# Patient Record
Sex: Female | Born: 1975 | Race: White | Hispanic: No | Marital: Married | Smoking: Never smoker
Health system: Southern US, Community
[De-identification: ages and names within clinical notes are randomized; demographics above are authoritative.]

---

## 2010-05-01 ENCOUNTER — Ambulatory Visit: Payer: Self-pay | Admitting: Internal Medicine

## 2017-11-07 ENCOUNTER — Inpatient Hospital Stay
Admission: EM | Admit: 2017-11-07 | Discharge: 2017-11-10 | DRG: 864 | Disposition: A | Discharge status: Other Acute Inpatient Hospital | Payer: BLUE CROSS/BLUE SHIELD | Attending: Obstetrics and Gynecology | Admitting: Obstetrics and Gynecology

## 2017-11-07 ENCOUNTER — Encounter: Payer: Self-pay | Admitting: Emergency Medicine

## 2017-11-07 ENCOUNTER — Other Ambulatory Visit: Payer: Self-pay

## 2017-11-07 ENCOUNTER — Other Ambulatory Visit (HOSPITAL_COMMUNITY): Payer: Self-pay | Admitting: Physician Assistant

## 2017-11-07 ENCOUNTER — Emergency Department: Payer: BLUE CROSS/BLUE SHIELD

## 2017-11-07 ENCOUNTER — Other Ambulatory Visit: Payer: Self-pay | Admitting: Physician Assistant

## 2017-11-07 ENCOUNTER — Inpatient Hospital Stay: Payer: BLUE CROSS/BLUE SHIELD

## 2017-11-07 ENCOUNTER — Ambulatory Visit
Admission: RE | Admit: 2017-11-07 | Discharge: 2017-11-07 | Disposition: A | Discharge status: Home / Self Care | Payer: BLUE CROSS/BLUE SHIELD | Source: Ambulatory Visit | Attending: Physician Assistant | Admitting: Physician Assistant

## 2017-11-07 DIAGNOSIS — R740 Nonspecific elevation of levels of transaminase and lactic acid dehydrogenase [LDH]: Secondary | ICD-10-CM | POA: Diagnosis present

## 2017-11-07 DIAGNOSIS — R197 Diarrhea, unspecified: Secondary | ICD-10-CM | POA: Diagnosis not present

## 2017-11-07 DIAGNOSIS — O020 Blighted ovum and nonhydatidiform mole: Secondary | ICD-10-CM

## 2017-11-07 DIAGNOSIS — B349 Viral infection, unspecified: Secondary | ICD-10-CM | POA: Diagnosis present

## 2017-11-07 DIAGNOSIS — Z3201 Encounter for pregnancy test, result positive: Secondary | ICD-10-CM

## 2017-11-07 DIAGNOSIS — D696 Thrombocytopenia, unspecified: Secondary | ICD-10-CM | POA: Diagnosis present

## 2017-11-07 DIAGNOSIS — O019 Hydatidiform mole, unspecified: Secondary | ICD-10-CM

## 2017-11-07 DIAGNOSIS — R509 Fever, unspecified: Secondary | ICD-10-CM | POA: Diagnosis present

## 2017-11-07 DIAGNOSIS — N858 Other specified noninflammatory disorders of uterus: Secondary | ICD-10-CM | POA: Diagnosis present

## 2017-11-07 DIAGNOSIS — N859 Noninflammatory disorder of uterus, unspecified: Secondary | ICD-10-CM | POA: Diagnosis present

## 2017-11-07 DIAGNOSIS — R102 Pelvic and perineal pain: Secondary | ICD-10-CM

## 2017-11-07 DIAGNOSIS — R0902 Hypoxemia: Secondary | ICD-10-CM

## 2017-11-07 DIAGNOSIS — R1084 Generalized abdominal pain: Secondary | ICD-10-CM

## 2017-11-07 DIAGNOSIS — R7401 Elevation of levels of liver transaminase levels: Secondary | ICD-10-CM

## 2017-11-07 DIAGNOSIS — R1011 Right upper quadrant pain: Secondary | ICD-10-CM

## 2017-11-07 LAB — CBC WITH DIFFERENTIAL/PLATELET
Basophils Absolute: 0 10*3/uL (ref 0–0.1)
Basophils Relative: 0 %
EOS ABS: 0 10*3/uL (ref 0–0.7)
Eosinophils Relative: 0 %
HEMATOCRIT: 47 % (ref 35.0–47.0)
HEMOGLOBIN: 16.3 g/dL — AB (ref 12.0–16.0)
LYMPHS ABS: 0.8 10*3/uL — AB (ref 1.0–3.6)
Lymphocytes Relative: 13 %
MCH: 30.7 pg (ref 26.0–34.0)
MCHC: 34.7 g/dL (ref 32.0–36.0)
MCV: 88.3 fL (ref 80.0–100.0)
MONOS PCT: 5 %
Monocytes Absolute: 0.3 10*3/uL (ref 0.2–0.9)
NEUTROS PCT: 82 %
Neutro Abs: 5 10*3/uL (ref 1.4–6.5)
Platelets: 187 10*3/uL (ref 150–440)
RBC: 5.33 MIL/uL — AB (ref 3.80–5.20)
RDW: 14.2 % (ref 11.5–14.5)
WBC: 6.1 10*3/uL (ref 3.6–11.0)

## 2017-11-07 LAB — URINALYSIS, COMPLETE (UACMP) WITH MICROSCOPIC
BILIRUBIN URINE: NEGATIVE
GLUCOSE, UA: NEGATIVE mg/dL
HGB URINE DIPSTICK: NEGATIVE
Ketones, ur: NEGATIVE mg/dL
Leukocytes, UA: NEGATIVE
NITRITE: NEGATIVE
Protein, ur: NEGATIVE mg/dL
Specific Gravity, Urine: >1.046 — ABNORMAL HIGH (ref 1.005–1.030)
pH: 5 (ref 5.0–8.0)

## 2017-11-07 LAB — APTT: APTT: 25 s (ref 24–36)

## 2017-11-07 LAB — COMPREHENSIVE METABOLIC PANEL
ALT: 77 U/L — AB (ref 14–54)
AST: 103 U/L — AB (ref 15–41)
Albumin: 4.5 g/dL (ref 3.5–5.0)
Alkaline Phosphatase: 138 U/L — ABNORMAL HIGH (ref 38–126)
Anion gap: 13 (ref 5–15)
BUN: 10 mg/dL (ref 6–20)
CHLORIDE: 104 mmol/L (ref 101–111)
CO2: 23 mmol/L (ref 22–32)
CREATININE: 0.75 mg/dL (ref 0.44–1.00)
Calcium: 9.4 mg/dL (ref 8.9–10.3)
GFR calc non Af Amer: >60 mL/min (ref >60)
Glucose, Bld: 93 mg/dL (ref 65–99)
POTASSIUM: 3.5 mmol/L (ref 3.5–5.1)
SODIUM: 140 mmol/L (ref 135–145)
Total Bilirubin: 0.8 mg/dL (ref 0.3–1.2)
Total Protein: 8.4 g/dL — ABNORMAL HIGH (ref 6.5–8.1)

## 2017-11-07 LAB — HCG, QUANTITATIVE, PREGNANCY: hCG, Beta Chain, Quant, S: <1 m[IU]/mL (ref <5)

## 2017-11-07 LAB — PREGNANCY, URINE: PREG TEST UR: NEGATIVE

## 2017-11-07 LAB — PROTIME-INR
INR: 0.97
Prothrombin Time: 12.8 seconds (ref 11.4–15.2)

## 2017-11-07 LAB — ABO/RH: ABO/RH(D): B NEG

## 2017-11-07 MED ORDER — IOHEXOL 300 MG/ML  SOLN
100.0000 mL | Freq: Once | INTRAMUSCULAR | Status: AC | PRN
  Administered 2017-11-07: 100 mL via INTRAVENOUS

## 2017-11-07 MED ORDER — ACETAMINOPHEN 325 MG PO TABS
650.0000 mg | ORAL_TABLET | Freq: Four times a day (QID) | ORAL | Status: DC | PRN
  Administered 2017-11-07 – 2017-11-08 (×4): 650 mg via ORAL
  Filled 2017-11-07 (×4): qty 2

## 2017-11-07 MED ORDER — ONDANSETRON HCL 4 MG/2ML IJ SOLN
INTRAMUSCULAR | Status: AC
  Administered 2017-11-07: 4 mg via INTRAVENOUS
  Filled 2017-11-07: qty 2

## 2017-11-07 MED ORDER — ONDANSETRON HCL 4 MG/2ML IJ SOLN
4.0000 mg | Freq: Four times a day (QID) | INTRAMUSCULAR | Status: DC | PRN
  Administered 2017-11-07 – 2017-11-10 (×6): 4 mg via INTRAVENOUS
  Filled 2017-11-07 (×5): qty 2

## 2017-11-07 MED ORDER — ONDANSETRON HCL 4 MG PO TABS
4.0000 mg | ORAL_TABLET | Freq: Four times a day (QID) | ORAL | Status: DC | PRN
  Administered 2017-11-09: 4 mg via ORAL
  Filled 2017-11-07: qty 1

## 2017-11-07 MED ORDER — LACTATED RINGERS IV SOLN
INTRAVENOUS | Status: DC
  Administered 2017-11-08 (×2): via INTRAVENOUS

## 2017-11-07 MED ORDER — DOCUSATE SODIUM 100 MG PO CAPS
100.0000 mg | ORAL_CAPSULE | Freq: Two times a day (BID) | ORAL | Status: DC
  Administered 2017-11-08: 100 mg via ORAL
  Filled 2017-11-07: qty 1

## 2017-11-07 MED ORDER — ACETAMINOPHEN 650 MG RE SUPP: 650.0000 mg | Freq: Four times a day (QID) | RECTAL | Status: DC | PRN

## 2017-11-07 MED ORDER — SODIUM CHLORIDE 0.9 % IV BOLUS
1000.0000 mL | Freq: Once | INTRAVENOUS | Status: AC
  Administered 2017-11-07: 1000 mL via INTRAVENOUS

## 2017-11-07 NOTE — ED Notes (Signed)
Patient transported to X-ray 

## 2017-11-07 NOTE — ED Provider Notes (Signed)
[facility] Emergency Department Provider Note  ____________________________________________  Time seen: Approximately 7:07 PM  I have reviewed the triage vital signs and the nursing notes.   HISTORY  Chief Complaint Pelvic Pain    HPI Meagan HumblesHeather Figueroa is a 42 y.o. female comes to the ED for follow-up of a abnormal outpatient ultrasound. She reports that she's had pelvic pain and cramping and abdominal bloating for the past week associated low-grade fever. He saw her doctor in clinic who obtained a urine pregnancy test which was positive according to the patient. She was sent to the ED for ultrasound.  Denies chills or sweats. Fevers are intermittent. No chest pain or shortness of breath. Discharge. Last menstrual period was 09/02/2017.  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      History reviewed. No pertinent past medical history.   There are no active problems to display for this patient.    History reviewed. No pertinent surgical history.   Prior to Admission medications   Not on File  none   Allergies Prednisone   History reviewed. No pertinent family history.  Social History Social History   Tobacco Use  . Smoking status: Never Smoker  . Smokeless tobacco: Never Used  Substance Use Topics  . Alcohol use: Not Currently  . Drug use: Never    Review of Systems  Constitutional:   positive fever without chills.  ENT:   No sore throat. No rhinorrhea. Cardiovascular:   No chest pain or syncope. Respiratory:   No dyspnea or cough. Gastrointestinal:   positive pelvic pain without vomiting and diarrhea.  Musculoskeletal:   Negative for focal pain or swelling All other systems reviewed and are negative except as documented above in ROS and HPI.  ____________________________________________   PHYSICAL EXAM:  VITAL SIGNS: ED Triage Vitals  Enc Vitals Group     BP 11/07/17 1727 (!) 134/91     Pulse  Rate 11/07/17 1727 98     Resp 11/07/17 1727 18     Temp 11/07/17 1727 (!) 100.5 F (38.1 C)     Temp Source 11/07/17 1727 Oral     SpO2 11/07/17 1727 96 %     Weight 11/07/17 1727 183 lb (83 kg)     Height 11/07/17 1727 5\' 8"  (1.727 m)     Head Circumference --      Peak Flow --      Pain Score 11/07/17 1732 3     Pain Loc --      Pain Edu? --      Excl. in GC? --     Vital signs reviewed, nursing assessments reviewed.   Constitutional:   Alert and oriented. Well appearing and in no distress. Eyes:   Conjunctivae are normal. EOMI. PERRL. ENT      Head:   Normocephalic and atraumatic.      Nose:   No congestion/rhinnorhea.       Mouth/Throat:   MMM, no pharyngeal erythema. No peritonsillar mass.       Neck:   No meningismus. Full ROM. Hematological/Lymphatic/Immunilogical:   No cervical lymphadenopathy. Cardiovascular:   RRR. Symmetric bilateral radial and DP pulses.  No murmurs.  Respiratory:   Normal respiratory effort without tachypnea/retractions. Breath sounds are clear and equal bilaterally. No wheezes/rales/rhonchi. Gastrointestinal:   Soft, suprapubic tenderness. Non distended. There is no CVA tenderness.  No rebound, rigidity, or guarding.  Musculoskeletal:   Normal range of motion in all extremities. No joint effusions.  No lower extremity tenderness.  No edema. Neurologic:   Normal speech and language.  Motor grossly intact. No acute focal neurologic deficits are appreciated.  Skin:    Skin is warm, dry and intact. No rash noted.  No petechiae, purpura, or bullae.  ____________________________________________    LABS (pertinent positives/negatives) (all labs ordered are listed, but only abnormal results are displayed) Labs Reviewed  COMPREHENSIVE METABOLIC PANEL - Abnormal; Notable for the following components:      Result Value   Total Protein 8.4 (*)    AST 103 (*)    ALT 77 (*)    Alkaline Phosphatase 138 (*)    All other components within normal limits   CBC WITH DIFFERENTIAL/PLATELET - Abnormal; Notable for the following components:   RBC 5.33 (*)    Hemoglobin 16.3 (*)    Lymphs Abs 0.8 (*)    All other components within normal limits  HCG, QUANTITATIVE, PREGNANCY  APTT  PROTIME-INR  URINALYSIS, COMPLETE (UACMP) WITH MICROSCOPIC  ABO/RH   ____________________________________________   EKG    ____________________________________________    RADIOLOGY  US Ob Less Than 14 Weeks With Ob Transvaginal  Result Date: 11/07/2017 CLINICAL DATA:  Pelvic cramping and pain with positive pregnancy test. EXAM: OBSTETRIC <14 WK [place] AND TRANSVAGINAL OB US TECHNIQUE: Both transabdominal and transvaginal ultrasound examinations were performed for complete evaluation of the gestation as well as the maternal uterus, adnexal regions, and pelvic cul-de-sac. Transvaginal technique was performed to assess early pregnancy. COMPARISON:  None. FINDINGS: Intrauterine gestational sac: Not visualized. Yolk sac:  Not visualized. Embryo:  Not visualized. Abnormal tissue fills and expands the endometrial canal to 5 cm in diameter. The abnormal tissue is diffusely hyperechoic with innumerable cystic foci contained within. This tissue tracks through the internal cervical os into the cervical canal. Subchorionic hemorrhage:  None visualized. Maternal uterus/adnexae: Maternal ovaries are unremarkable. No evidence for intraperitoneal free fluid. IMPRESSION: Abnormal hyperechoic tissue in the endometrial cavity with innumerable cystic foci. Ultrasound imaging features are consistent with gestational trophoblastic disease which includes etiologies such as hydatidiform mole and choriocarcinoma. These results will be called to the ordering clinician or representative by the Radiologist Assistant, and communication documented in the PACS or zVision Dashboard. Electronically Signed   By: Kennith Center M.D.   On: 11/07/2017 15:54     ____________________________________________   PROCEDURES Procedures  ____________________________________________    CLINICAL IMPRESSION / ASSESSMENT AND PLAN / ED COURSE  Pertinent labs & imaging results that were available during my care of the patient were reviewed by me and considered in my medical decision making (see chart for details).      Clinical Course as of Nov 07 1908  Fri Nov 07, 2017  1807 ultrasound shows molar pregnancy. Case discussed with gynecology Dr. Bascom Levels  who will evaluate. No current evidence of cardiopulmonary complication.    [PS]    Clinical Course User Index [PS] Sharman Cheek, MD     ____________________________________________   FINAL CLINICAL IMPRESSION(S) / ED DIAGNOSES    Final diagnoses:  Gestational trophoblastic disease     ED Discharge Orders    None      Portions of this note were generated with dragon dictation software. Dictation errors may occur despite best attempts at proofreading.    Sharman Cheek, MD 11/07/17 1910

## 2017-11-07 NOTE — H&P (Signed)
History and Physical  Meagan Figueroa is an 42 y.o. female.  HPI: Patient presents today from an urgent care. She was being seen for a fever. She was found to be pregnant and an ultrasound was ordered. The ultrasound showed a molar pregnancy and she was recalled to the ER. She reports that she has been having fevers and chills all week.  She also feels like her glands in her throat were swollen.  She has diffuse body aches. she denies any cough or nasal drainage.  She denies any nausea or vomiting.  She does not know when the last time she had a menstrual period.  She reports that for the last 6 months she has been having several days of small brown spotting generally at the end of the month which she is considered a period.  She has a lifelong history of abnormal menstrual periods.  She says that she generally does not get periods.  The last time she had a normal period was 4 years ago after her wedding.  She has been having regular intercourse and is unsure of when she would have conceived.  She does not use any form of contraception.  She is seen occasional bleeding after intercourse which she thought was normal because of less moisture.  The bleeding is generally small and only when she wipes.   She has no prior pregnancies.  She is not interested in conceiving in the future and is open to the idea of a hysterectomy.  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  She and her husband brought with her a signed and notarized statement of this wish.   History reviewed. No pertinent past medical history.  History reviewed. No pertinent surgical history.  History reviewed. No pertinent family history.  Social History:  reports that she has never smoked. She has never used smokeless tobacco. She reports that she drank alcohol. She reports that she does not use drugs.  Allergies:  Allergies  Allergen Reactions  . Prednisone     Facial numbness     Medications: I have reviewed the patient's current medications.  Results for orders placed or performed during the hospital encounter of 11/07/17 (from the past 48 hour(s))  Comprehensive metabolic panel     Status: Abnormal   Collection Time: 11/07/17  5:47 PM  Result Value Ref Range   Sodium 140 135 - 145 mmol/L   Potassium 3.5 3.5 - 5.1 mmol/L   Chloride 104 101 - 111 mmol/L   CO2 23 22 - 32 mmol/L   Glucose, Bld 93 65 - 99 mg/dL   BUN 10 6 - 20 mg/dL   Creatinine, Ser 0.75 0.44 - 1.00 mg/dL   Calcium 9.4 8.9 - 10.3 mg/dL   Total Protein 8.4 (H) 6.5 - 8.1 g/dL   Albumin 4.5 3.5 - 5.0 g/dL   AST 103 (H) 15 - 41 U/L   ALT 77 (H) 14 - 54 U/L   Alkaline Phosphatase 138 (H) 38 - 126 U/L   Total Bilirubin 0.8 0.3 - 1.2 mg/dL   GFR calc non Af Amer >60 >60 mL/min   GFR calc Af Amer >60 >60 mL/min    Comment: (NOTE) The eGFR has been calculated using the CKD EPI equation. This calculation has not been validated in all clinical situations. eGFR's persistently <60 mL/min signify possible Chronic Kidney Disease.    Anion gap 13 5 - 15    Comment: Performed  at [facility], [address] [place], [place] [address]  CBC with Differential     Status: Abnormal   Collection Time: 11/07/17  5:47 PM  Result Value Ref Range   WBC 6.1 3.6 - 11.0 K/uL   RBC 5.33 (H) 3.80 - 5.20 MIL/uL   Hemoglobin 16.3 (H) 12.0 - 16.0 g/dL   HCT 47.0 35.0 - 47.0 %   MCV 88.3 80.0 - 100.0 fL   MCH 30.7 26.0 - 34.0 pg   MCHC 34.7 32.0 - 36.0 g/dL   RDW 14.2 11.5 - 14.5 %   Platelets 187 150 - 440 K/uL   Neutrophils Relative % 82 %   Neutro Abs 5.0 1.4 - 6.5 K/uL   Lymphocytes Relative 13 %   Lymphs Abs 0.8 (L) 1.0 - 3.6 K/uL   Monocytes Relative 5 %   Monocytes Absolute 0.3 0.2 - 0.9 K/uL   Eosinophils Relative 0 %   Eosinophils Absolute 0.0 0 - 0.7 K/uL   Basophils Relative 0 %   Basophils Absolute 0.0 0 - 0.1 K/uL    Comment: Performed at [facility], [address] [place], [place] [address]  ABO/Rh     Status: None   Collection Time: 11/07/17  5:47 PM  Result Value Ref Range   ABO/RH(D)      B NEG Performed at [facility], [address] [place], [place] [address]   hCG, quantitative, pregnancy     Status: None   Collection Time: 11/07/17  5:47 PM  Result Value Ref Range   hCG, Beta Chain, Quant, S <1 <5 mIU/mL    Comment:          GEST. AGE      CONC.  (mIU/mL)   <=1 WEEK        5 - 50     2 WEEKS       50 - 500     3 WEEKS       100 - 10,000     4 WEEKS     1,000 - 30,000     5 WEEKS     3,500 - 115,000   6-8 WEEKS     12,000 - 270,000    12 WEEKS     15,000 - 220,000        FEMALE AND NON-PREGNANT FEMALE:     LESS THAN 5 mIU/mL Performed at [facility], [address] [place], [place] [address]     Dg Chest 2 View  Addendum Date: 11/07/2017   ADDENDUM REPORT: 11/07/2017 20:03 ADDENDUM: Note that under the clinical data, it should read molar pregnancy instead of more pregnancy. Electronically Signed   By: Marin Olp M.D.   On: 11/07/2017 20:03   Result Date: 11/07/2017 CLINICAL DATA:  More pregnancy. Pelvic pain. Fever and weakness 4 days. EXAM: CHEST - 2 VIEW COMPARISON:  None. FINDINGS: Lungs are adequately inflated and otherwise clear. Cardiomediastinal silhouette is within normal. Bones and soft tissues are normal. IMPRESSION: No active cardiopulmonary disease. Electronically Signed: By: Marin Olp M.D. On: 11/07/2017 19:41   US Ob Less Than 14 Weeks With Ob Transvaginal  Result Date: 11/07/2017 CLINICAL DATA:  Pelvic cramping and pain with positive pregnancy test. EXAM: OBSTETRIC <14 WK [place] AND TRANSVAGINAL OB US TECHNIQUE: Both transabdominal and transvaginal ultrasound examinations were performed for complete evaluation of the gestation as well as the maternal uterus, adnexal regions, and pelvic cul-de-sac. Transvaginal technique was performed to assess  early pregnancy. COMPARISON:  None. FINDINGS:  Intrauterine gestational sac: Not visualized. Yolk sac:  Not visualized. Embryo:  Not visualized. Abnormal tissue fills and expands the endometrial canal to 5 cm in diameter. The abnormal tissue is diffusely hyperechoic with innumerable cystic foci contained within. This tissue tracks through the internal cervical os into the cervical canal. Subchorionic hemorrhage:  None visualized. Maternal uterus/adnexae: Maternal ovaries are unremarkable. No evidence for intraperitoneal free fluid. IMPRESSION: Abnormal hyperechoic tissue in the endometrial cavity with innumerable cystic foci. Ultrasound imaging features are consistent with gestational trophoblastic disease which includes etiologies such as hydatidiform mole and choriocarcinoma. These results will be called to the ordering clinician or representative by the Radiologist Assistant, and communication documented in the PACS or zVision Dashboard. Electronically Signed   By: Misty Stanley M.D.   On: 11/07/2017 15:54    Review of Systems  Constitutional: Negative for chills, fever, malaise/fatigue and weight loss.  HENT: Negative for congestion, hearing loss and sinus pain.   Eyes: Negative for blurred vision and double vision.  Respiratory: Negative for cough, sputum production, shortness of breath and wheezing.   Cardiovascular: Negative for chest pain, palpitations, orthopnea and leg swelling.  Gastrointestinal: Negative for abdominal pain, constipation, diarrhea, nausea and vomiting.  Genitourinary: Negative for dysuria, flank pain, frequency, hematuria and urgency.  Musculoskeletal: Negative for back pain, falls and joint pain.  Skin: Negative for itching and rash.  Neurological: Negative for dizziness and headaches.  Psychiatric/Behavioral: Negative for depression, substance abuse and suicidal ideas. The patient is not nervous/anxious.    Blood pressure (!) 134/91, pulse 98, temperature (!) 100.5 F (38.1 C), temperature source Oral, resp. rate  18, height _0  (1.727 m), weight 183 lb (83 kg), last menstrual period 09/02/2017, SpO2 96 %. Physical Exam  Nursing note and vitals reviewed. Constitutional: She is oriented to person, place, and time. She appears well-developed and well-nourished.  HENT:  Head: Normocephalic and atraumatic.    Neck: Neck supple. No thyromegaly present.  Cardiovascular: Normal rate and regular rhythm.  Respiratory: Effort normal and breath sounds normal. No respiratory distress. She has no wheezes.  GI: Soft. Bowel sounds are normal. She exhibits no distension and no mass. There is no tenderness. There is no rebound and no guarding.  Genitourinary: Vagina normal and uterus normal. There is no rash, tenderness, lesion or injury on the right labia. There is no rash, tenderness, lesion or injury on the left labia. Cervix exhibits no motion tenderness, no discharge and no friability. No erythema or bleeding in the vagina. No vaginal discharge found.  Genitourinary Comments: Normal cervix, no bleeding, no abnormal discharge. No molar pregnancy tissue seen coming from the cervical os. Enlarged 15cm uterus. Not fixed, mobile, normal adnexa. Right sided adnexal tenderness.   Musculoskeletal: Normal range of motion.  Neurological: She is alert and oriented to person, place, and time.  Skin: Skin is warm and dry.  Psychiatric: She has a normal mood and affect. Her behavior is normal. Judgment and thought content normal.    Assessment/Plan: 42 yo with a suspected molar pregnancy.  1. CBC, CMP, APTT, PT, INR, type and screen, chest x-ray and beta hcg.   2. Discussed plan of care with surgical management. Patient is open to the idea of a hysterectomy since she is over 22 and does not wish to have any future pregnancies. Will plan for when she is not ill. Beta hcg was less than 1. Will repeat level, this may have been a lab error.  3. Acute viral illness, supportive measures at this time with IV fluids and tylenol for  fever.  4. Elevated alk phos and tranaminases, will obtain abdominal CT to evaluated liver for metastatic lesions.  5. Elevated hemoglobin, will obtain UA to evaluate for dehydration and peripheral smear.     Christanna R Schuman 11/07/2017, 8:36 PM

## 2017-11-07 NOTE — ED Triage Notes (Signed)
Pt presents to ED via POV with c/o pelvic pain and cramping. Pt states was told to check in to the ER due to a growth on her ovaries. Pt had US done here earlier today and had a positive pregnancy test, and fever and body aches. Pt denies bleeding at this time.

## 2017-11-08 ENCOUNTER — Encounter
Admission: EM | Disposition: A | Discharge status: Other Acute Inpatient Hospital | Payer: Self-pay | Source: Home / Self Care | Attending: Obstetrics and Gynecology

## 2017-11-08 DIAGNOSIS — D696 Thrombocytopenia, unspecified: Secondary | ICD-10-CM

## 2017-11-08 DIAGNOSIS — N859 Noninflammatory disorder of uterus, unspecified: Secondary | ICD-10-CM

## 2017-11-08 DIAGNOSIS — R509 Fever, unspecified: Secondary | ICD-10-CM

## 2017-11-08 LAB — LIPASE, BLOOD: Lipase: 20 U/L (ref 11–51)

## 2017-11-08 LAB — CBC
HCT: 40.8 % (ref 35.0–47.0)
HEMOGLOBIN: 14 g/dL (ref 12.0–16.0)
MCH: 30.2 pg (ref 26.0–34.0)
MCHC: 34.3 g/dL (ref 32.0–36.0)
MCV: 88 fL (ref 80.0–100.0)
Platelets: 158 10*3/uL (ref 150–440)
RBC: 4.64 MIL/uL (ref 3.80–5.20)
RDW: 13.8 % (ref 11.5–14.5)
WBC: 5.3 10*3/uL (ref 3.6–11.0)

## 2017-11-08 LAB — COMPREHENSIVE METABOLIC PANEL
ALBUMIN: 3.5 g/dL (ref 3.5–5.0)
ALK PHOS: 112 U/L (ref 38–126)
ALT: 66 U/L — ABNORMAL HIGH (ref 14–54)
AST: 83 U/L — AB (ref 15–41)
Anion gap: 8 (ref 5–15)
BILIRUBIN TOTAL: 0.8 mg/dL (ref 0.3–1.2)
BUN: 9 mg/dL (ref 6–20)
CALCIUM: 8.4 mg/dL — AB (ref 8.9–10.3)
CO2: 22 mmol/L (ref 22–32)
Chloride: 110 mmol/L (ref 101–111)
Creatinine, Ser: 0.72 mg/dL (ref 0.44–1.00)
GFR calc Af Amer: >60 mL/min (ref >60)
GFR calc non Af Amer: >60 mL/min (ref >60)
GLUCOSE: 110 mg/dL — AB (ref 65–99)
POTASSIUM: 3.5 mmol/L (ref 3.5–5.1)
Sodium: 140 mmol/L (ref 135–145)
Total Protein: 6.6 g/dL (ref 6.5–8.1)

## 2017-11-08 LAB — LACTIC ACID, PLASMA: Lactic Acid, Venous: 1.1 mmol/L (ref 0.5–1.9)

## 2017-11-08 SURGERY — DILATION AND CURETTAGE
Anesthesia: Choice

## 2017-11-08 MED ORDER — PIPERACILLIN-TAZOBACTAM 3.375 G IVPB
3.3750 g | Freq: Three times a day (TID) | INTRAVENOUS | Status: DC
  Administered 2017-11-08 – 2017-11-10 (×6): 3.375 g via INTRAVENOUS
  Filled 2017-11-08 (×7): qty 50

## 2017-11-08 MED ORDER — SODIUM CHLORIDE 0.9 % IV BOLUS
1000.0000 mL | Freq: Once | INTRAVENOUS | Status: AC
  Administered 2017-11-08: 1000 mL via INTRAVENOUS

## 2017-11-08 MED ORDER — METRONIDAZOLE 500 MG PO TABS
500.0000 mg | ORAL_TABLET | Freq: Three times a day (TID) | ORAL | Status: DC
  Administered 2017-11-08 – 2017-11-09 (×5): 500 mg via ORAL
  Filled 2017-11-08 (×8): qty 1

## 2017-11-08 MED ORDER — POTASSIUM CHLORIDE IN NACL 20-0.45 MEQ/L-% IV SOLN
INTRAVENOUS | Status: DC
  Administered 2017-11-08: 15:00:00 via INTRAVENOUS
  Filled 2017-11-08 (×2): qty 1000

## 2017-11-08 MED ORDER — LACTATED RINGERS IV BOLUS
1000.0000 mL | Freq: Once | INTRAVENOUS | Status: AC
  Administered 2017-11-08: 1000 mL via INTRAVENOUS

## 2017-11-08 MED ORDER — IBUPROFEN 600 MG PO TABS
600.0000 mg | ORAL_TABLET | Freq: Four times a day (QID) | ORAL | Status: DC | PRN
  Administered 2017-11-08: 600 mg via ORAL
  Filled 2017-11-08: qty 1

## 2017-11-08 SURGICAL SUPPLY — 55 items
BAG COUNTER SPONGE EZ (MISCELLANEOUS) ×2 IMPLANT
BAG URINE DRAINAGE (UROLOGICAL SUPPLIES) ×3 IMPLANT
BLADE SURG SZ11 CARB STEEL (BLADE) ×3 IMPLANT
CANISTER SUCT 1200ML W/VALVE (MISCELLANEOUS) ×3 IMPLANT
CATH FOLEY 2WAY  5CC 16FR (CATHETERS) ×2
CATH ROBINSON RED A/P 16FR (CATHETERS) ×3 IMPLANT
CATH URTH 16FR FL 2W BLN LF (CATHETERS) ×1 IMPLANT
CHLORAPREP W/TINT 26ML (MISCELLANEOUS) ×3 IMPLANT
COUNTER SPONGE BAG EZ (MISCELLANEOUS) ×1
DEFOGGER SCOPE WARMER CLEARIFY (MISCELLANEOUS) ×3 IMPLANT
DERMABOND ADVANCED (GAUZE/BANDAGES/DRESSINGS) ×2
DERMABOND ADVANCED .7 DNX12 (GAUZE/BANDAGES/DRESSINGS) ×1 IMPLANT
DEVICE SUTURE ENDOST 10MM (ENDOMECHANICALS) IMPLANT
DRAPE CAMERA CLOSED 9X96 (DRAPES) ×3 IMPLANT
GLOVE BIO SURGEON STRL SZ8 (GLOVE) ×3 IMPLANT
GLOVE SKINSENSE NS SZ6.5 (GLOVE) ×10
GLOVE SKINSENSE STRL SZ6.5 (GLOVE) ×5 IMPLANT
GLOVE SURG SYN 6.5 ES PF (GLOVE) ×3 IMPLANT
GOWN STRL REUS W/ TWL LRG LVL3 (GOWN DISPOSABLE) ×3 IMPLANT
GOWN STRL REUS W/ TWL XL LVL3 (GOWN DISPOSABLE) ×1 IMPLANT
GOWN STRL REUS W/TWL LRG LVL3 (GOWN DISPOSABLE) ×6
GOWN STRL REUS W/TWL XL LVL3 (GOWN DISPOSABLE) ×2
GRASPER SUT TROCAR 14GX15 (MISCELLANEOUS) IMPLANT
IRRIGATION STRYKERFLOW (MISCELLANEOUS) IMPLANT
IRRIGATOR STRYKERFLOW (MISCELLANEOUS)
IV LACTATED RINGERS 1000ML (IV SOLUTION) ×6 IMPLANT
KIT PINK PAD W/HEAD ARE REST (MISCELLANEOUS) ×3
KIT PINK PAD W/HEAD ARM REST (MISCELLANEOUS) ×1 IMPLANT
KIT TURNOVER CYSTO (KITS) ×6 IMPLANT
LIGASURE LAP MARYLAND 5MM 37CM (ELECTROSURGICAL) IMPLANT
MANIPULATOR VCARE LG CRV RETR (MISCELLANEOUS) IMPLANT
MANIPULATOR VCARE SML CRV RETR (MISCELLANEOUS) IMPLANT
MANIPULATOR VCARE STD CRV RETR (MISCELLANEOUS) IMPLANT
NEEDLE VERESS 14GA 120MM (NEEDLE) IMPLANT
NS IRRIG 500ML POUR BTL (IV SOLUTION) ×3 IMPLANT
OCCLUDER COLPOPNEUMO (BALLOONS) ×3 IMPLANT
PACK DNC HYST (MISCELLANEOUS) ×3 IMPLANT
PACK GYN LAPAROSCOPIC (MISCELLANEOUS) ×3 IMPLANT
PAD OB MATERNITY 4.3X12.25 (PERSONAL CARE ITEMS) ×3 IMPLANT
PAD PREP 24X41 OB/GYN DISP (PERSONAL CARE ITEMS) ×3 IMPLANT
SCISSORS METZENBAUM CVD 33 (INSTRUMENTS) IMPLANT
SET CYSTO W/LG BORE CLAMP LF (SET/KITS/TRAYS/PACK) ×3 IMPLANT
SHEARS HARMONIC ACE PLUS 36CM (ENDOMECHANICALS) IMPLANT
SLEEVE ENDOPATH XCEL 5M (ENDOMECHANICALS) ×3 IMPLANT
SUT ENDO VLOC 180-0-8IN (SUTURE) IMPLANT
SUT MNCRL 4-0 (SUTURE) ×2
SUT MNCRL 4-0 27XMFL (SUTURE) ×1
SUT VIC AB 0 CT1 36 (SUTURE) ×3 IMPLANT
SUTURE MNCRL 4-0 27XMF (SUTURE) ×1 IMPLANT
SYR 10ML LL (SYRINGE) ×3 IMPLANT
SYR 50ML LL SCALE MARK (SYRINGE) ×3 IMPLANT
TOWEL OR 17X26 4PK STRL BLUE (TOWEL DISPOSABLE) ×3 IMPLANT
TROCAR ENDO BLADELESS 11MM (ENDOMECHANICALS) ×3 IMPLANT
TROCAR XCEL NON-BLD 5MMX100MML (ENDOMECHANICALS) ×3 IMPLANT
TUBING INSUF HEATED (TUBING) ×3 IMPLANT

## 2017-11-08 NOTE — Progress Notes (Signed)
Patient ID: Meagan Figueroa, female   DOB: [birth year], 42 y.o.   MRN: [id number]  Daily Benign Gynecology Progress Note Meagan Figueroa  [id number]  HD#2 admitted with fever of unknown origin and uterine mass  Subjective:  Overnight Events: patient became disoriented and required some O2 for saturation support Complaints: weakness, mild nausea, fevers, sometimes feels cold, denies chills, occasional heart palpitations. She denies: chest pain, trouble breathing, vomiting, severe abdominal pain.  She has tolerated: minimal regular diet (not hungry or thirsty) She reports her pain is well controlled without requiring pain medication She is ambulating though she has become disoriented a little dizzy and is voiding.  Objective:  Most recent vitals Temp: (!) 101.7 F (38.7 C)  BP: 106/73  Pulse Rate: 90  Resp: 18  SpO2: 96 %   Vitals Range over 24 hours Temp  Avg: 101 F (38.3 C)  Min: 99.1 F (37.3 C)  Max: 102.7 F (39.3 C) BP  Min: 93/61  Max: 134/91 Pulse  Avg: 84.2  Min: 71  Max: 98 SpO2  Avg: 95.3 %  Min: 92 %  Max: 100 %   Physical Exam Physical Exam  Constitutional: She is oriented to person, place, and time. She appears well-developed and well-nourished. No distress.  Pale, appears lethargic  HENT:  Head: Normocephalic and atraumatic.  Eyes: Conjunctivae are normal. No scleral icterus.  Neck: No thyromegaly present.  Cardiovascular: Normal rate and regular rhythm.  Pulmonary/Chest: Breath sounds normal. No stridor. No respiratory distress. She has no wheezes. She exhibits no tenderness.  Abdominal: Soft. Bowel sounds are normal. She exhibits no distension and no mass. There is no hepatosplenomegaly. There is no tenderness. There is no rebound, no guarding, no CVA tenderness and negative Murphy's sign. No hernia.  Musculoskeletal: Normal range of motion. She exhibits no edema.  Lymphadenopathy:    She has cervical adenopathy.  Neurological: She is alert and oriented to person,  place, and time. No cranial nerve deficit.  Skin: Skin is warm and dry. There is pallor.  Psychiatric: She has a normal mood and affect. Her behavior is normal. Judgment normal.   AM Labs Lab Results  Component Value Date   WBC 5.3 11/08/2017   HGB 14.0 11/08/2017   HCT 40.8 11/08/2017   PLT 158 11/08/2017   NA 140 11/08/2017   K 3.5 11/08/2017   CREATININE 0.72 11/08/2017   BUN 9 11/08/2017   INR 0.97 11/07/2017   Recent Labs    11/07/17 1747 11/08/17 0514  AST 103* 83*  ALT 77* 66*    Recent Labs    11/08/17 0514  LIPASE 20    Recent Labs    11/07/17 1747 11/07/17 2023  HCGBETAQNT <1 <1    Recent Labs    11/07/17 2252  PREGTESTUR NEGATIVE    Recent Labs    11/08/17 0514  LATICACIDVEN 1.1     Assessment:  Meagan Figueroa is a 42 y.o. female HD#2 admitted with fever of unknown origin and uterine mass.    Plan:  Fever of unknown origin: * UA not suggestive of UTI * CXR normal * blood cultures x 2 pending * abd/pelvic CT negative * hospitalist consult to assist.   * suspect gastroenteritis, but with temp to 102F+, must consider other sources * IVF bolus for dehyration  Transaminitis:  * LFTs trending down * hospitalist consult  O2 requirement:  * doing well on room air currently * hospitalist consult  Uterine mass:  * spoke with [facility].  The fellow I spoke with [facility] as well as a [facility]. They do not believe this to be GTN based on the two negative beta hCG values and negative urine pregnancy test.  They are concerned about the mass in her uterus. However, they (nor I) believe it is the cause of her current symptoms. They state they will follow her up in clinic on Tuesday with their own lab draw prior to the clinic appointment. They will arrange this and call the patient on Monday pending [facility] by that time.  They do not think at this time that a transfer is  appropriate or necessary given the available information. Though close and quick follow up is warranted.   * Another beta hCG is pending (this was at request of the lab): no result yet. If positive , will contact [facility] again to see if this changes their recommendations. * CT and CXR not suggestive of metastatic disease at this point.  The imaging is, however, quite concerning. We did discuss the imaging and my concerns for disease that needs immediate attention.   * for now will give her IV fluid bolus. Will give her medication for nausea and support her fever episodes.  She may be a candidate for discharge either later today or tomorrow, pending hospitalist consult. She does not appear septic and is not meeting SIRS criteria.  Disposition is pending clinical course.   I spent > 60 minutes in reviewing this case, discussing the case with the off-going attending, discussing the case with doctors from [facility].  I spent > 30 minutes discussing all of the above with the patient and her family. She has been having fevers off and on since Tuesday. She had diarrhea on Wednesday and drank an herbal tea that caused her diarrhea to stop. She reports anorexia since that time and started having nausea yesterday with a couple of episodes of emesis.  Fevers continue. She has had no sick contacts of whom she aware.    Thomasene MohairStephen Linkoln Alkire, MD  11/08/2017 12:26 PM

## 2017-11-08 NOTE — Progress Notes (Signed)
Spoke with Dr. Jean RosenthalJackson in regards to pt temp of 103 after tylenol. Going to obtain another set of VS and MD to put in order for Motrin. Will cont to monitor

## 2017-11-08 NOTE — Progress Notes (Signed)
Dr. Jean RosenthalJackson updated of pt temp 98.5. MD acknowledged

## 2017-11-08 NOTE — Progress Notes (Signed)
Patient temperature elevated to 102.6 orally. Dr. Jerene PitchSchuman notified of temp and to decide treatment due to NPO status. MD order to give PO tylenol for fever. PO tylenol given, along with IV zofran for nausea. Patient then stated that she needed to go to bathroom to void. Patient sat up on side of bed and stated that she was very dizzy. RN called NT to come assist with ambulation to bathroom. Upon standing up at bedside, the patient became pale, diaphoretic, non-verbal, and unsteady. Patient sat back down onto bed. RN asking patient questions with little to no response. Patient then became more aware of surroundings and stated she wished to go to bathroom right now. Patient ambulated into bathroom with RN and NT assistance. Patient voided 400 mL of clear, yellow urine. Patient assisted back to bed. Lab arrived to draw morning labs and blood cultures. Patient vials obtained- BP 93/61, Respirations 22, Temp 100.6, Pulse 82. Dr. Jerene PitchSchuman paged at this time and rapid response called. Shift coordinator called, also. Dr. Jerene PitchSchuman responded and came to room to assess patient. Rapid response team arrived. 1 liter bolus of normal saline hung  and 2 L O2 given via nasal cannula per MD order. Vitals- BP 109/74, pulse 81, O2 95, respirations 26. Patient condition/appearance improved. Patient was more alert, conscious, and responsive at this time. Rapid response team dismissed. Dr. Jerene PitchSchuman spoke with family about condition and answered many questions. Awaiting lab results. Patient advanced to regular (gulten free) diet. MD to come reevaluate patient later. RN will continue to monitor.

## 2017-11-08 NOTE — Consult Note (Signed)
History and Physical    Meagan HumblesHeather Mclin ZOX:[id number] DOB: 24-Sep-1975 DOA: 11/07/2017  Referring physician: Dr. Jean RosenthalJackson PCP: Patient, No Pcp Per  Specialists: none  Chief Complaint: fever  HPI: Meagan Figueroa is a 42 y.o. female with no significant past medical history who was admitted yesterday with abdominal pain and fever by GYN. Pregnancy has been ruled out. Has had intermittent fevers up to 102 degress for over a week now. Infection w/u negative except for some undefined intrauterine abnormality seen on CT. We are now consulted due to persistent fevers. Pt has had slight abdominal pain and nausea. No significant vomiting or diarrhea. CXR and UA negative. WBC normal. Blood cultures NTD  Review of Systems: The patient denies anorexia, weight loss,, vision loss, decreased hearing, hoarseness, chest pain, syncope, dyspnea on exertion, peripheral edema, balance deficits, hemoptysis,  melena, hematochezia, severe indigestion/heartburn, hematuria, incontinence, genital sores, muscle weakness, suspicious skin lesions, transient blindness, difficulty walking, depression, unusual weight change, abnormal bleeding, enlarged lymph nodes, angioedema, and breast masses.   History reviewed. No pertinent past medical history. History reviewed. No pertinent surgical history. Social History:  reports that she has never smoked. She has never used smokeless tobacco. She reports that she drank alcohol. She reports that she does not use drugs.  Allergies  Allergen Reactions  . Prednisone     Facial numbness    History reviewed. No pertinent family history.  Prior to Admission medications   Not on File   Physical Exam: Vitals:   11/08/17 0737 11/08/17 1036 11/08/17 1040 11/08/17 1042  BP: 104/65 106/65 103/68 106/73  Pulse: 71 77 81 90  Resp: 18     Temp: 99.3 F (37.4 C) (!) 101.7 F (38.7 C)    TempSrc: Oral Oral    SpO2: 96% 95% 100% 96%  Weight:      Height:         General:  No  apparent distress, WDWN, [place]/AT  Eyes: PERRL, EOMI, no scleral icterus, conjucntiva clear  ENT: moist oropharynx without exudate, TM's benign, dentition fair  Neck: supple, slight lymphadenopathy noted. No bruits or thyromegaly  Cardiovascular: regular rate without MRG; 2+ peripheral pulses, no JVD, no peripheral edema  Respiratory: CTA biL, good air movement without wheezing, rhonchi or crackled. Respiratory effort normal  Abdomen: soft, mildly tender to palpation, positive bowel sounds, no guarding, no rebound  Skin: no rashes or lesions  Musculoskeletal: normal bulk and tone, no joint swelling  Psychiatric: normal mood and affect, A&OX3  Neurologic: CN 2-12 grossly intact, Motor strength 5/5 in all 4 groups with symmetric DTR's and non-focal sensory exam  Labs on Admission:  Basic Metabolic Panel: Recent Labs  Lab 11/07/17 1747 11/08/17 0514  NA 140 140  K 3.5 3.5  CL 104 110  CO2 23 22  GLUCOSE 93 110*  BUN 10 9  CREATININE 0.75 0.72  CALCIUM 9.4 8.4*   Liver Function Tests: Recent Labs  Lab 11/07/17 1747 11/08/17 0514  AST 103* 83*  ALT 77* 66*  ALKPHOS 138* 112  BILITOT 0.8 0.8  PROT 8.4* 6.6  ALBUMIN 4.5 3.5   Recent Labs  Lab 11/08/17 0514  LIPASE 20   No results for input(s): AMMONIA in the last 168 hours. CBC: Recent Labs  Lab 11/07/17 1747 11/08/17 0514  WBC 6.1 5.3  NEUTROABS 5.0  --   HGB 16.3* 14.0  HCT 47.0 40.8  MCV 88.3 88.0  PLT 187 158   Cardiac Enzymes: No results for input(s): CKTOTAL, CKMB,  CKMBINDEX, TROPONINI in the last 168 hours.  BNP (last 3 results) No results for input(s): BNP in the last 8760 hours.  ProBNP (last 3 results) No results for input(s): PROBNP in the last 8760 hours.  CBG: No results for input(s): GLUCAP in the last 168 hours.  Radiological Exams on Admission: Dg Chest 2 View  Addendum Date: 11/07/2017   ADDENDUM REPORT: 11/07/2017 20:03 ADDENDUM: Note that under the clinical data, it should read  molar pregnancy instead of more pregnancy. Electronically Signed   By: Elberta Fortis M.D.   On: 11/07/2017 20:03   Result Date: 11/07/2017 CLINICAL DATA:  More pregnancy. Pelvic pain. Fever and weakness 4 days. EXAM: CHEST - 2 VIEW COMPARISON:  None. FINDINGS: Lungs are adequately inflated and otherwise clear. Cardiomediastinal silhouette is within normal. Bones and soft tissues are normal. IMPRESSION: No active cardiopulmonary disease. Electronically Signed: By: Elberta Fortis M.D. On: 11/07/2017 19:41   Ct Abdomen Pelvis W Contrast  Result Date: 11/07/2017 CLINICAL DATA:  Fevers and chills all week. Diagnosed with a molar pregnancy earlier today. EXAM: CT ABDOMEN AND PELVIS WITH CONTRAST TECHNIQUE: Multidetector CT imaging of the abdomen and pelvis was performed using the standard protocol following bolus administration of intravenous contrast. CONTRAST:  OMNIPAQUE IOHEXOL 300 MG/ML  SOLN COMPARISON:  Pelvic ultrasound from same day. FINDINGS: Lower chest: No acute abnormality.  Bibasilar atelectasis. Hepatobiliary: No focal liver abnormality is seen. Possible tiny gallstones near the gallbladder neck. Gallbladder fundal adenomyomatosis. No gallbladder wall thickening or biliary dilatation. Pancreas: Unremarkable. No pancreatic ductal dilatation or surrounding inflammatory changes. Spleen: Normal in size without focal abnormality. Adrenals/Urinary Tract: Adrenal glands are unremarkable. Kidneys are normal, without renal calculi, focal lesion, or hydronephrosis. Bladder is unremarkable. Stomach/Bowel: Stomach is within normal limits. Appendix appears normal. No evidence of bowel wall thickening, distention, or inflammatory changes. Vascular/Lymphatic: No significant vascular findings are present. No enlarged abdominal or pelvic lymph nodes. Reproductive: Heterogeneous low-density tissue with areas of internal septation expanding the endometrial canal. The bilateral adnexa are unremarkable. Other: Small fat  containing left inguinal hernia. No free fluid or pneumoperitoneum. Musculoskeletal: No acute or significant osseous findings. IMPRESSION: 1. Heterogeneous low-density tissue expanding the endometrial canal, consistent with gestational trophoblastic disease. 2. No other acute abnormality within the abdomen or pelvis. Electronically Signed   By: Obie Dredge M.D.   On: 11/07/2017 23:03   US Ob Less Than 14 Weeks With Ob Transvaginal  Result Date: 11/07/2017 CLINICAL DATA:  Pelvic cramping and pain with positive pregnancy test. EXAM: OBSTETRIC <14 WK [place] AND TRANSVAGINAL OB US TECHNIQUE: Both transabdominal and transvaginal ultrasound examinations were performed for complete evaluation of the gestation as well as the maternal uterus, adnexal regions, and pelvic cul-de-sac. Transvaginal technique was performed to assess early pregnancy. COMPARISON:  None. FINDINGS: Intrauterine gestational sac: Not visualized. Yolk sac:  Not visualized. Embryo:  Not visualized. Abnormal tissue fills and expands the endometrial canal to 5 cm in diameter. The abnormal tissue is diffusely hyperechoic with innumerable cystic foci contained within. This tissue tracks through the internal cervical os into the cervical canal. Subchorionic hemorrhage:  None visualized. Maternal uterus/adnexae: Maternal ovaries are unremarkable. No evidence for intraperitoneal free fluid. IMPRESSION: Abnormal hyperechoic tissue in the endometrial cavity with innumerable cystic foci. Ultrasound imaging features are consistent with gestational trophoblastic disease which includes etiologies such as hydatidiform mole and choriocarcinoma. These results will be called to the ordering clinician or representative by the Radiologist Assistant, and communication documented in the PACS or zVision  Dashboard. Electronically Signed   By: Kennith Center M.D.   On: 11/07/2017 15:54    EKG: Independently reviewed.  Assessment/Plan Active Problems:   Molar  pregnancy   Cultures sent. Begin empiric IV ABX. Source of fever appears to be intrauterine. Repeat labs in AM. Consider uteroscopy. Will follow  Diet: per GYN Fluids: 1/2 ND @50  DVT Prophylaxis: per GYN  Code Status: FULL  Family Communication: yes  Disposition Plan: TBD  Time spent: 50 min

## 2017-11-09 ENCOUNTER — Inpatient Hospital Stay: Payer: BLUE CROSS/BLUE SHIELD

## 2017-11-09 DIAGNOSIS — R509 Fever, unspecified: Secondary | ICD-10-CM | POA: Diagnosis present

## 2017-11-09 DIAGNOSIS — R7401 Elevation of levels of liver transaminase levels: Secondary | ICD-10-CM | POA: Diagnosis present

## 2017-11-09 DIAGNOSIS — R197 Diarrhea, unspecified: Secondary | ICD-10-CM

## 2017-11-09 DIAGNOSIS — D696 Thrombocytopenia, unspecified: Secondary | ICD-10-CM

## 2017-11-09 DIAGNOSIS — R740 Nonspecific elevation of levels of transaminase and lactic acid dehydrogenase [LDH]: Secondary | ICD-10-CM

## 2017-11-09 DIAGNOSIS — N859 Noninflammatory disorder of uterus, unspecified: Secondary | ICD-10-CM

## 2017-11-09 LAB — CBC WITH DIFFERENTIAL/PLATELET
BASOS ABS: 0 10*3/uL (ref 0–0.1)
BASOS ABS: 0 10*3/uL (ref 0–0.1)
BASOS PCT: 0 %
Basophils Relative: 0 %
Eosinophils Absolute: 0 10*3/uL (ref 0–0.7)
Eosinophils Absolute: 0 10*3/uL (ref 0–0.7)
Eosinophils Relative: 0 %
Eosinophils Relative: 0 %
HCT: 36 % (ref 35.0–47.0)
HEMATOCRIT: 34.8 % — AB (ref 35.0–47.0)
HEMOGLOBIN: 12.3 g/dL (ref 12.0–16.0)
HEMOGLOBIN: 12.7 g/dL (ref 12.0–16.0)
LYMPHS PCT: 18 %
Lymphocytes Relative: 15 %
Lymphs Abs: 0.6 10*3/uL — ABNORMAL LOW (ref 1.0–3.6)
Lymphs Abs: 0.9 10*3/uL — ABNORMAL LOW (ref 1.0–3.6)
MCH: 30.9 pg (ref 26.0–34.0)
MCH: 30.8 pg (ref 26.0–34.0)
MCHC: 35.4 g/dL (ref 32.0–36.0)
MCHC: 35.2 g/dL (ref 32.0–36.0)
MCV: 87.4 fL (ref 80.0–100.0)
MCV: 87.4 fL (ref 80.0–100.0)
MONO ABS: 0.3 10*3/uL (ref 0.2–0.9)
MONO ABS: 0.3 10*3/uL (ref 0.2–0.9)
MONOS PCT: 8 %
Monocytes Relative: 5 %
NEUTROS ABS: 2.6 10*3/uL (ref 1.4–6.5)
NEUTROS ABS: 4.5 10*3/uL (ref 1.4–6.5)
NEUTROS PCT: 74 %
NEUTROS PCT: 80 %
Platelets: 115 10*3/uL — ABNORMAL LOW (ref 150–440)
Platelets: 120 10*3/uL — ABNORMAL LOW (ref 150–440)
RBC: 3.98 MIL/uL (ref 3.80–5.20)
RBC: 4.12 MIL/uL (ref 3.80–5.20)
RDW: 13.8 % (ref 11.5–14.5)
RDW: 13.7 % (ref 11.5–14.5)
WBC: 3.5 10*3/uL — ABNORMAL LOW (ref 3.6–11.0)
WBC: 5.7 10*3/uL (ref 3.6–11.0)

## 2017-11-09 LAB — COMPREHENSIVE METABOLIC PANEL
ALBUMIN: 3 g/dL — AB (ref 3.5–5.0)
ALT: 100 U/L — ABNORMAL HIGH (ref 14–54)
ALT: 88 U/L — AB (ref 14–54)
AST: 140 U/L — AB (ref 15–41)
AST: 88 U/L — AB (ref 15–41)
Albumin: 3 g/dL — ABNORMAL LOW (ref 3.5–5.0)
Alkaline Phosphatase: 101 U/L (ref 38–126)
Alkaline Phosphatase: 101 U/L (ref 38–126)
Anion gap: 7 (ref 5–15)
Anion gap: 6 (ref 5–15)
BILIRUBIN TOTAL: 0.8 mg/dL (ref 0.3–1.2)
BUN: 7 mg/dL (ref 6–20)
BUN: 6 mg/dL (ref 6–20)
CO2: 27 mmol/L (ref 22–32)
CO2: 27 mmol/L (ref 22–32)
Calcium: 8.1 mg/dL — ABNORMAL LOW (ref 8.9–10.3)
Calcium: 8.1 mg/dL — ABNORMAL LOW (ref 8.9–10.3)
Chloride: 107 mmol/L (ref 101–111)
Chloride: 105 mmol/L (ref 101–111)
Creatinine, Ser: 0.75 mg/dL (ref 0.44–1.00)
Creatinine, Ser: 0.84 mg/dL (ref 0.44–1.00)
GFR calc Af Amer: >60 mL/min (ref >60)
GFR calc Af Amer: >60 mL/min (ref >60)
GFR calc non Af Amer: >60 mL/min (ref >60)
GLUCOSE: 115 mg/dL — AB (ref 65–99)
Glucose, Bld: 101 mg/dL — ABNORMAL HIGH (ref 65–99)
POTASSIUM: 3.7 mmol/L (ref 3.5–5.1)
POTASSIUM: 4 mmol/L (ref 3.5–5.1)
Sodium: 141 mmol/L (ref 135–145)
Sodium: 138 mmol/L (ref 135–145)
TOTAL PROTEIN: 5.9 g/dL — AB (ref 6.5–8.1)
Total Bilirubin: 1 mg/dL (ref 0.3–1.2)
Total Protein: 5.5 g/dL — ABNORMAL LOW (ref 6.5–8.1)

## 2017-11-09 LAB — GASTROINTESTINAL PANEL BY PCR, STOOL (REPLACES STOOL CULTURE)
ASTROVIRUS: NOT DETECTED
Adenovirus F40/41: NOT DETECTED
CAMPYLOBACTER SPECIES: NOT DETECTED
Cryptosporidium: NOT DETECTED
Cyclospora cayetanensis: NOT DETECTED
ENTEROPATHOGENIC E COLI (EPEC): NOT DETECTED
ENTEROTOXIGENIC E COLI (ETEC): NOT DETECTED
Entamoeba histolytica: NOT DETECTED
Enteroaggregative E coli (EAEC): NOT DETECTED
Giardia lamblia: NOT DETECTED
NOROVIRUS GI/GII: NOT DETECTED
PLESIMONAS SHIGELLOIDES: NOT DETECTED
ROTAVIRUS A: NOT DETECTED
SAPOVIRUS (I, II, IV, AND V): NOT DETECTED
SHIGA LIKE TOXIN PRODUCING E COLI (STEC): NOT DETECTED
Salmonella species: NOT DETECTED
Shigella/Enteroinvasive E coli (EIEC): NOT DETECTED
Vibrio cholerae: NOT DETECTED
Vibrio species: NOT DETECTED
Yersinia enterocolitica: NOT DETECTED

## 2017-11-09 LAB — THYROID PANEL WITH TSH
Free Thyroxine Index: 1.6 (ref 1.2–4.9)
T3 Uptake Ratio: 20 % — ABNORMAL LOW (ref 24–39)
T4, Total: 7.8 ug/dL (ref 4.5–12.0)
TSH: 0.894 u[IU]/mL (ref 0.450–4.500)

## 2017-11-09 LAB — LACTIC ACID, PLASMA: Lactic Acid, Venous: 1 mmol/L (ref 0.5–1.9)

## 2017-11-09 LAB — BETA HCG QUANT (REF LAB): hCG Quant: <1 m[IU]/mL

## 2017-11-09 LAB — HIV ANTIBODY (ROUTINE TESTING W REFLEX): HIV Screen 4th Generation wRfx: NONREACTIVE

## 2017-11-09 MED ORDER — IBUPROFEN 100 MG/5ML PO SUSP
600.0000 mg | Freq: Four times a day (QID) | ORAL | Status: DC | PRN
  Administered 2017-11-09: 600 mg via ORAL
  Filled 2017-11-09 (×4): qty 30

## 2017-11-09 MED ORDER — PANTOPRAZOLE SODIUM 40 MG PO TBEC
40.0000 mg | DELAYED_RELEASE_TABLET | Freq: Every day | ORAL | Status: DC
  Administered 2017-11-09: 40 mg via ORAL
  Filled 2017-11-09: qty 1

## 2017-11-09 MED ORDER — ALUM & MAG HYDROXIDE-SIMETH 200-200-20 MG/5ML PO SUSP
30.0000 mL | ORAL | Status: DC | PRN
  Administered 2017-11-09: 30 mL via ORAL
  Filled 2017-11-09: qty 30

## 2017-11-09 MED ORDER — SODIUM CHLORIDE 0.9 % IV SOLN
Freq: Once | INTRAVENOUS | Status: AC
  Administered 2017-11-09: 11:00:00 via INTRAVENOUS

## 2017-11-09 NOTE — Progress Notes (Signed)
Patient ID: Meagan HumblesHeather Figueroa, female   DOB: 1976/01/30, 42 y.o.   MRN: [id number]  Daily Benign Gynecology Progress Note Meagan Figueroa  [id number]  HD#3 admitted with fever of unknown origin and uterine mass  Subjective:  Overnight Events: no acute events, fever to 103F yesterday evening. Resolved with ibuprofen.  Complaints: more generalized aching in her abdomen, still predominantly upper abdominal, now with diarrhea. Still has nausea without emesis.  She denies: chills, chest pain, trouble breathing, vomiting. She has tolerated: minimal food PO She reports her pain is well controlled. She is ambulating and is voiding.  Objective:  Most recent vitals Temp: 98.9 F (37.2 C)  BP: 105/68  Pulse Rate: 61  Resp: 18  SpO2: 94 %   Vitals Range over 24 hours Temp  Avg: 99.8 F (37.7 C)  Min: 97.9 F (36.6 C)  Max: 103 F (39.4 C) BP  Min: 103/71  Max: 112/70 Pulse  Avg: 62.2  Min: 55  Max: 75 SpO2  Avg: 95.5 %  Min: 94 %  Max: 97 %   Urine Output: 650 cc over 12 hours.    Physical Exam Physical Exam  Constitutional: She is oriented to person, place, and time. She appears well-developed and well-nourished.  Appears pale and lethargic  HENT:  Head: Normocephalic and atraumatic.  Eyes: Conjunctivae are normal. No scleral icterus.  Cardiovascular: Normal rate and regular rhythm.  Pulmonary/Chest: Effort normal.  Decreased at the bases.   Abdominal: Soft. Bowel sounds are normal. She exhibits mass (uterus based on previous films). She exhibits no distension. There is tenderness (mild in epigastrium and RUQ, mild at suprapubic to deep palpation). There is no rebound and no guarding.  Musculoskeletal: Normal range of motion. She exhibits no edema.  Neurological: She is alert and oriented to person, place, and time. No cranial nerve deficit.  Skin: Skin is warm and dry. No erythema.  Psychiatric: She has a normal mood and affect. Her behavior is normal. Judgment normal.    AM Labs Recent  Labs    11/07/17 1747 11/08/17 0514 11/09/17 0533  AST 103* 83* 140*  ALT 77* 66* 100*  HGB 16.3* 14.0 12.3  HCT 47.0 40.8 34.8*  PLT 187 158 115*    Recent Labs    11/08/17 0535  CULT NO GROWTH < 24 HOURS Performed at [facility], [address] [place], [place] [address]      Assessment:  Meagan Figueroa is a 42 y.o. female HD#3 admitted with uterine mass and fever of unknown origin.  Plan:  Fever of unknown origin: * Status post hospitalist consult. Appreciate input and recommendations. * GI panel and hepatitis panel today * CXR normal * blood cultures x 2 negative to date * abd/pelvic CT negative for obvious source (apart from uterine findings) * UA not suggestive * RUQ U/S ordered given fever and LFT increase today * per hospitalist, likely source is uterine, though still not definitive   Transaminitis:  * increase today * stop hepatotoxic medication (tylenol for now) * RUQ ultrasound * GI panel and hepatitis panel today * other recommendations per hospitalist  O2 requirement: * improved  Thrombocytopenia: * new, per hospitalist recommendations  Uterine mass: * sample when possible. Still no bleeding from this source * if unable to discharge in time for [facility] appointment, may sample in-house early this coming week.  Diarrhea: * will hold if C. Diff pending, consider loperamide if no C. Diff. * continue to support with IV fluid.  Prophylaxis:  *  PO protonix * SCDs  Diet: regular IVF: per hospitalist  Dispo: pending clinical improvement   Thomasene Mohair, MD  11/09/2017 11:04 AM

## 2017-11-09 NOTE — Progress Notes (Signed)
Pt off floor to XRAY. Fluids stopped. Pt alert and oriented and in nad.

## 2017-11-09 NOTE — Progress Notes (Signed)
Patient vomited about 20 seconds after swallowing protonix pill and refused other pills at that time (see previous note). Pharmacist states that flagyl pill can be crushed. Patient does not want to take pill with the nourishment options we can provide. Family member to bring "flavored" applesauce in for patient to mix crushed pill with. Patient to call RN when that family member arrives.

## 2017-11-09 NOTE — Progress Notes (Signed)
Patient temperature 100.8 at 1742. RN has contacted pharmacy twice to send ibuprofen suspension. RN to give med to patient upon arrival.

## 2017-11-09 NOTE — Progress Notes (Signed)
[facility] - [place] [facility]   PATIENT NAME: Meagan Figueroa    MR#:  [id number]  DATE OF BIRTH:  [birth year]  SUBJECTIVE:  patient was admitted with high-grade fever and abdominal pain. Her fever curve is improving. She is having some nausea and started with diarrhea today. Some right upper quadrant pain. No vomiting. Denies any cough or dysuria.  REVIEW OF SYSTEMS:   Review of Systems  Constitutional: Positive for fever and malaise/fatigue. Negative for chills and weight loss.  HENT: Negative for ear discharge, ear pain and nosebleeds.   Eyes: Negative for blurred vision, pain and discharge.  Respiratory: Negative for sputum production, shortness of breath, wheezing and stridor.   Cardiovascular: Negative for chest pain, palpitations, orthopnea and PND.  Gastrointestinal: Positive for nausea. Negative for abdominal pain, diarrhea and vomiting.  Genitourinary: Negative for frequency and urgency.  Musculoskeletal: Negative for back pain and joint pain.  Neurological: Positive for weakness. Negative for sensory change, speech change and focal weakness.  Psychiatric/Behavioral: Negative for depression and hallucinations. The patient is not nervous/anxious.    Tolerating Diet: Tolerating PT:   DRUG ALLERGIES:   Allergies  Allergen Reactions  . Prednisone     Facial numbness    VITALS:  Blood pressure 97/67, pulse 63, temperature 99.2 F (37.3 C), temperature source Oral, resp. rate 18, height 5\' 8"  (1.727 m), weight 83 kg (183 lb), last menstrual period 09/02/2017, SpO2 99 %.  PHYSICAL EXAMINATION:   Physical Exam  GENERAL:  42 y.o.-year-old patient lying in the bed with no acute distress.  EYES: Pupils equal, round, reactive to light and accommodation. No scleral icterus. Extraocular muscles intact.  HEENT: Head atraumatic, normocephalic. Oropharynx and nasopharynx clear.  NECK:  Supple, no jugular venous distention. No thyroid enlargement, no  tenderness.  LUNGS: Normal breath sounds bilaterally, no wheezing, rales, rhonchi. No use of accessory muscles of respiration.  CARDIOVASCULAR: S1, S2 normal. No murmurs, rubs, or gallops.  ABDOMEN: Soft, nontender, nondistended. Bowel sounds present. No organomegaly or mass.  EXTREMITIES: No cyanosis, clubbing or edema b/l.    NEUROLOGIC: Cranial nerves II through XII are intact. No focal Motor or sensory deficits b/l.   PSYCHIATRIC:  patient is alert and oriented x 3.  SKIN: No obvious rash, lesion, or ulcer.   LABORATORY PANEL:  CBC Recent Labs  Lab 11/09/17 0533  WBC 3.5*  HGB 12.3  HCT 34.8*  PLT 115*    Chemistries  Recent Labs  Lab 11/09/17 0533  NA 141  K 3.7  CL 107  CO2 27  GLUCOSE 101*  BUN 7  CREATININE 0.75  CALCIUM 8.1*  AST 140*  ALT 100*  ALKPHOS 101  BILITOT 0.8   Cardiac Enzymes No results for input(s): TROPONINI in the last 168 hours. RADIOLOGY:  Dg Chest 2 View  Addendum Date: 11/07/2017   ADDENDUM REPORT: 11/07/2017 20:03 ADDENDUM: Note that under the clinical data, it should read molar pregnancy instead of more pregnancy. Electronically Signed   By: Elberta Fortis M.D.   On: 11/07/2017 20:03   Result Date: 11/07/2017 CLINICAL DATA:  More pregnancy. Pelvic pain. Fever and weakness 4 days. EXAM: CHEST - 2 VIEW COMPARISON:  None. FINDINGS: Lungs are adequately inflated and otherwise clear. Cardiomediastinal silhouette is within normal. Bones and soft tissues are normal. IMPRESSION: No active cardiopulmonary disease. Electronically Signed: By: Elberta Fortis M.D. On: 11/07/2017 19:41   Ct Abdomen Pelvis W Contrast  Result Date: 11/07/2017 CLINICAL DATA:  Fevers and chills  all week. Diagnosed with a molar pregnancy earlier today. EXAM: CT ABDOMEN AND PELVIS WITH CONTRAST TECHNIQUE: Multidetector CT imaging of the abdomen and pelvis was performed using the standard protocol following bolus administration of intravenous contrast. CONTRAST:  100mL OMNIPAQUE  IOHEXOL 300 MG/ML  SOLN COMPARISON:  Pelvic ultrasound from same day. FINDINGS: Lower chest: No acute abnormality.  Bibasilar atelectasis. Hepatobiliary: No focal liver abnormality is seen. Possible tiny gallstones near the gallbladder neck. Gallbladder fundal adenomyomatosis. No gallbladder wall thickening or biliary dilatation. Pancreas: Unremarkable. No pancreatic ductal dilatation or surrounding inflammatory changes. Spleen: Normal in size without focal abnormality. Adrenals/Urinary Tract: Adrenal glands are unremarkable. Kidneys are normal, without renal calculi, focal lesion, or hydronephrosis. Bladder is unremarkable. Stomach/Bowel: Stomach is within normal limits. Appendix appears normal. No evidence of bowel wall thickening, distention, or inflammatory changes. Vascular/Lymphatic: No significant vascular findings are present. No enlarged abdominal or pelvic lymph nodes. Reproductive: Heterogeneous low-density tissue with areas of internal septation expanding the endometrial canal. The bilateral adnexa are unremarkable. Other: Small fat containing left inguinal hernia. No free fluid or pneumoperitoneum. Musculoskeletal: No acute or significant osseous findings. IMPRESSION: 1. Heterogeneous low-density tissue expanding the endometrial canal, consistent with gestational trophoblastic disease. 2. No other acute abnormality within the abdomen or pelvis. Electronically Signed   By: Obie DredgeWilliam T Derry M.D.   On: 11/07/2017 23:03   [place] Ob Less Than 14 Weeks With Ob Transvaginal  Result Date: 11/07/2017 CLINICAL DATA:  Pelvic cramping and pain with positive pregnancy test. EXAM: OBSTETRIC <14 WK US AND TRANSVAGINAL OB US TECHNIQUE: Both transabdominal and transvaginal ultrasound examinations were performed for complete evaluation of the gestation as well as the maternal uterus, adnexal regions, and pelvic cul-de-sac. Transvaginal technique was performed to assess early pregnancy. COMPARISON:  None. FINDINGS:  Intrauterine gestational sac: Not visualized. Yolk sac:  Not visualized. Embryo:  Not visualized. Abnormal tissue fills and expands the endometrial canal to 5 cm in diameter. The abnormal tissue is diffusely hyperechoic with innumerable cystic foci contained within. This tissue tracks through the internal cervical os into the cervical canal. Subchorionic hemorrhage:  None visualized. Maternal uterus/adnexae: Maternal ovaries are unremarkable. No evidence for intraperitoneal free fluid. IMPRESSION: Abnormal hyperechoic tissue in the endometrial cavity with innumerable cystic foci. Ultrasound imaging features are consistent with gestational trophoblastic disease which includes etiologies such as hydatidiform mole and choriocarcinoma. These results will be called to the ordering clinician or representative by the Radiologist Assistant, and communication documented in the PACS or zVision Dashboard. Electronically Signed   By: Kennith CenterEric  Mansell M.D.   On: 11/07/2017 15:54   ASSESSMENT AND PLAN:  Meagan HumblesHeather Figueroa is a 42 y.o. female with no significant past medical history who was admitted yesterday with abdominal pain and fever by GYN. Pregnancy has been ruled out. Has had intermittent fevers up to 102 degress for over a week now.  1. febrile illness -so far infectious workup has been negative. Not sure if there is inflammatory reactive changes given abnormal pelvic ultrasound -blood culture is negative. Patient placed temporarily count Zosyn and Flagyl -LFTs borderline elevated with mild low platelet count and normal white count with fever curve favors viral etiology also -G.I. stool PCR -check ultrasound of the abdomen right upper quadrant -acute hepatitis panel -UA negative for UTI, chest x-ray negative, blood cultures negative -CT abdomen essentially negative other than tiny calcified gallstones hands getting ultrasound of the abdomen right upper quadrant   2. nausea secondary to suspected  gastritis -PPI -encourage oral intake  3. Abnormal  pelvic/uterine ultrasound -per GYN  Double was discussed with Dr. Jean Rosenthal GYN Case discussed with Care Management/Social Worker. Management plans discussed with the patient, family and they are in agreement.  CODE STATUS: full  DVT Prophylaxis: ambulation  TOTAL TIME TAKING CARE OF THIS PATIENT: 30 minutes.  >50% time spent on counselling and coordination of care  POSSIBLE D/C IN *1-2* DAYS, DEPENDING ON CLINICAL CONDITION.  Note: This dictation was prepared with Dragon dictation along with smaller phrase technology. Any transcriptional errors that result from this process are unintentional.  Enedina Finner M.D on 11/09/2017 at 1:31 PM  Between 7am to 6pm - Pager - [phone]  After 6pm go to [email or web address] - password [email or web address]  [facility]  Office  [phone]  CC: Primary care physician; Patient, No Pcp PerPatient ID: Meagan Figueroa, female   DOB: Oct 18, 1975, 42 y.o.   MRN: [id number]

## 2017-11-09 NOTE — Progress Notes (Signed)
Patient has been afebrile since 21:05 on 6/8. Other vitals stable and WDL today. Patient complains of intermittent nausea. Patient states zofran has been helping her nausea. Some lightheadedness when ambulating to the restroom. Patient too nauseous to eat breakfast but tried grapes, ice cream, and casserole around lunch time. Patient also drank a cup of water at this time. Patient given protonix at 1409. Patient vomited right after taking med. Patient states she feels like there is a pill stuck in her throat from a flagyl dose during the night. Flagyl held at this time due to vomiting. Patient asked if flagyl can be crushed. RN to consult pharmacist about this. If pill cannot be crushed, RN to call provider.   Ultrasound tech spoke with RN. Ultrasound tech states that patient must be NPO for 6-8 hours before ultrasound. Ultrasound to be done tomorrow morning (6/10) due to patient eating at lunch time. Patient to be NPO after midnight. RN updated patient/family about this.

## 2017-11-10 DIAGNOSIS — R740 Nonspecific elevation of levels of transaminase and lactic acid dehydrogenase [LDH]: Secondary | ICD-10-CM

## 2017-11-10 DIAGNOSIS — B349 Viral infection, unspecified: Secondary | ICD-10-CM

## 2017-11-10 DIAGNOSIS — D696 Thrombocytopenia, unspecified: Secondary | ICD-10-CM

## 2017-11-10 DIAGNOSIS — N859 Noninflammatory disorder of uterus, unspecified: Secondary | ICD-10-CM

## 2017-11-10 LAB — HEPATITIS PANEL, ACUTE
Hep A IgM: NEGATIVE
Hep B C IgM: NEGATIVE
Hepatitis B Surface Ag: NEGATIVE

## 2017-11-10 LAB — PATHOLOGIST SMEAR REVIEW

## 2017-11-10 MED ORDER — LOPERAMIDE HCL 2 MG PO CAPS
2.0000 mg | ORAL_CAPSULE | Freq: Once | ORAL | Status: DC
  Filled 2017-11-10: qty 1

## 2017-11-10 NOTE — Progress Notes (Signed)
[facility] at [id number] for transfer report.

## 2017-11-10 NOTE — Progress Notes (Signed)
[facility] [place] at [facility]   PATIENT NAME: Meagan Figueroa    MR#:  [id number]  DATE OF BIRTH:  1976/04/26  SUBJECTIVE:  patient was admitted with high-grade fever and abdominal pain. Patient had fever of 103.2 yesterday at 8 PM none overnight feels little better after vomiting yesterday able to eat now no fever today family in the room  REVIEW OF SYSTEMS:   Review of Systems  Constitutional: Positive for fever and malaise/fatigue. Negative for chills and weight loss.  HENT: Negative for ear discharge, ear pain and nosebleeds.   Eyes: Negative for blurred vision, pain and discharge.  Respiratory: Negative for sputum production, shortness of breath, wheezing and stridor.   Cardiovascular: Negative for chest pain, palpitations, orthopnea and PND.  Gastrointestinal: Positive for nausea. Negative for abdominal pain, diarrhea and vomiting.  Genitourinary: Negative for frequency and urgency.  Musculoskeletal: Negative for back pain and joint pain.  Neurological: Positive for weakness. Negative for sensory change, speech change and focal weakness.  Psychiatric/Behavioral: Negative for depression and hallucinations. The patient is not nervous/anxious.    Tolerating Diet:yes Tolerating PT: ambulatory  DRUG ALLERGIES:   Allergies  Allergen Reactions  . Prednisone     Facial numbness    VITALS:  Blood pressure 110/73, pulse 63, temperature 97.7 F (36.5 C), temperature source Oral, resp. rate 18, height 5\' 8"  (1.727 m), weight 83 kg (183 lb), last menstrual period 09/02/2017, SpO2 94 %.  PHYSICAL EXAMINATION:   Physical Exam  GENERAL:  42 y.o.-year-old patient lying in the bed with no acute distress.  EYES: Pupils equal, round, reactive to light and accommodation. No scleral icterus. Extraocular muscles intact.  HEENT: Head atraumatic, normocephalic. Oropharynx and nasopharynx clear.  NECK:  Supple, no jugular venous distention. No thyroid  enlargement, no tenderness.  LUNGS: Normal breath sounds bilaterally, no wheezing, rales, rhonchi. No use of accessory muscles of respiration.  CARDIOVASCULAR: S1, S2 normal. No murmurs, rubs, or gallops.  ABDOMEN: Soft, nontender, nondistended. Bowel sounds present. No organomegaly or mass.  EXTREMITIES: No cyanosis, clubbing or edema b/l.    NEUROLOGIC: Cranial nerves II through XII are intact. No focal Motor or sensory deficits b/l.   PSYCHIATRIC:  patient is alert and oriented x 3.  SKIN: No obvious rash, lesion, or ulcer.   LABORATORY PANEL:  CBC Recent Labs  Lab 11/09/17 2306  WBC 5.7  HGB 12.7  HCT 36.0  PLT 120*    Chemistries  Recent Labs  Lab 11/09/17 2306  NA 138  K 4.0  CL 105  CO2 27  GLUCOSE 115*  BUN 6  CREATININE 0.84  CALCIUM 8.1*  AST 88*  ALT 88*  ALKPHOS 101  BILITOT 1.0   Cardiac Enzymes No results for input(s): TROPONINI in the last 168 hours. RADIOLOGY:  Dg Chest 2 View  Result Date: 11/09/2017 CLINICAL DATA:  Hypoxemia EXAM: CHEST - 2 VIEW COMPARISON:  11/07/2017 FINDINGS: Normal heart size, mediastinal contours, and pulmonary vascularity. Subsegmental atelectasis RIGHT base. Atelectasis versus consolidation in retrocardiac LEFT lower lobe. Remaining lungs clear. No pleural effusion or pneumothorax. Bones unremarkable. IMPRESSION: RIGHT basilar atelectasis with atelectasis versus consolidation in retrocardiac LEFT lower lobe. Electronically Signed   By: Ulyses SouthwardMark  Boles M.D.   On: 11/09/2017 23:01   ASSESSMENT AND PLAN:  Meagan Figueroa is a 42 y.o. female with no significant past medical history who was admitted yesterday with abdominal pain and fever by GYN. Pregnancy has been ruled out. Has had intermittent fevers up to  102 degress for over a week now.  1. febrile illness -so far infectious workup has been negative. Not sure if there is inflammatory reactive changes given abnormal pelvic ultrasound -blood culture is negative. Patient placed  temporarily count Zosyn and Flagyl---d/ced abxs -LFTs borderline elevated with mild low platelet count and normal white count with fever curve favors viral etiology also -G.I. stool PCR negative -acute hepatitis panel pending -UA negative for UTI, chest x-ray negative, blood cultures negative -CT abdomen essentially negative other than tiny calcified gallstones. No abd pain currently  2. nausea secondary to suspected gastritis -PPI -encourage oral intake  3. Abnormal pelvic/uterine ultrasound -per GYN  Case was discussed with Dr. Jerene Pitch GYN-- who has made arrangements for patient to be transferred to [facility] for for the workup on her uterine mass   Case discussed with Care Management/Social Worker. Management plans discussed with the patient, family and they are in agreement.  CODE STATUS: full  DVT Prophylaxis: ambulation  TOTAL TIME TAKING CARE OF THIS PATIENT: 30 minutes.  >50% time spent on counselling and coordination of care  POSSIBLE D/C IN *1-2* DAYS, DEPENDING ON CLINICAL CONDITION.  Note: This dictation was prepared with Dragon dictation along with smaller phrase technology. Any transcriptional errors that result from this process are unintentional.  Enedina Finner M.D on 11/10/2017 at 8:38 AM  Between 7am to 6pm - Pager - [phone]  After 6pm go to [email or web address] - password [email or web address]  [facility]  Office  [phone]  CC: Primary care physician; Patient, No Pcp PerPatient ID: Meagan Figueroa, female   DOB: 09-Sep-1975, 42 y.o.   MRN: [id number]

## 2017-11-10 NOTE — Progress Notes (Signed)
Spoke with Meagan Figueroa in radiology and he stated that he would power share pts imaging to [facility].

## 2017-11-10 NOTE — Progress Notes (Signed)
[facility] Doctor, general practicestretcher

## 2017-11-10 NOTE — Progress Notes (Signed)
[facility] here for pt transport.  Report given to Paramedic with copied requested chart material.  EMTALA form signed.

## 2017-11-10 NOTE — Discharge Summary (Signed)
Discharge Summary   Patient ID: Meagan Figueroa [id number] 41 y.o. 09-08-1975  Admit date: 11/07/2017  Discharge date: 11/10/2017  Principal Diagnoses:  1) Fever of unknown origin 2) uterine mass 3) transaminitis 4) thrombocytopenia  Secondary Diagnoses:  none  Procedures performed during the hospitalization:  11/07/17: Pelvic ultrasound 11/07/17: CT abdomen/pelvis 11/07/17: CXR 11/08/17: CXR General medicine consult Antibiotic treatment   HPI: Patient presents today from an urgent care. She was being seen for a fever. She was found to be pregnant and an ultrasound was ordered. The ultrasound showed a molar pregnancy and she was recalled to the ER. She reports that she has been having fevers and chills all week.  She also feels like her glands in her throat were swollen.  She has diffuse body aches. she denies any cough or nasal drainage.  She denies any nausea or vomiting.  She does not know when the last time she had a menstrual period.  She reports that for the last 6 months she has been having several days of small brown spotting generally at the end of the month which she is considered a period.  She has a lifelong history of abnormal menstrual periods.  She says that she generally does not get periods.  The last time she had a normal period was 4 years ago after her wedding.  She has been having regular intercourse and is unsure of when she would have conceived.  She does not use any form of contraception.  She is seen occasional bleeding after intercourse which she thought was normal because of less moisture.  The bleeding is generally small and only when she wipes.   She has no prior pregnancies.  She is not interested in conceiving in the future and is open to the idea of a hysterectomy.  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  She and her husband brought with her a signed and notarized statement of this wish.   Past Medical  History: Denies  Past Surgical History: Denies  Allergies  Allergen Reactions  . Prednisone     Facial numbness    Social History   Tobacco Use  . Smoking status: Never Smoker  . Smokeless tobacco: Never Used  Substance Use Topics  . Alcohol use: Not Currently  . Drug use: Never   Family History: denies gynecologic cancer history  Hospital Course:  The patient was admitted for her pelvic mass that was initially suspected to be a molar pregnancy. However, she never had a positive pregnancy test. She had three quantitative hCG tests that were negative and a negative urine pregnancy test.  In addition, she had a fever that was initially lower. She had elevated liver enzymes, as well. On the morning of admission she had an episode where she had an oxygen desaturation and was given supplemental oxygen. She was later wean off of this.  The hospitalist was consulted regarding her fever of unknown origin. Already obtained, were a UA, CXR, and peripheral blood cultures x 2.  The hospitalist suggested that the source of fever was likely the uterus and she was started on Zosyn and Flagyl.  By her second hospital day she continued to spike fevers to 103F.  She was given ibuprofen and Tylenol for this. Her LFTs continued to increase. So, her Tylenol was discontinued.  She went nearly 22 hours with no fever and she had a recurrence of her fever, again to 103F.  Her  CBC also showed a decrease in blood counts and platelets to 115.  She continued to feel poorly with little-to-no improvement in symptoms. She had very little appetite and was nauseated while here. She also developed diarrhea after starting antibiotics.  A GI panel of infectious organisms was ordered and was negative. Testing for C. Diff was negative.  She has an acute hepatitis panel that is pending at the time of transfer.  Due to an oxygen desaturation to 89% she had a repeat CXR that showed atelectasis and perhaps a small area of focal  consolidation on the left lung in the base.  Throughout her hospitalization, her other vital signs remained normal. Her uterine mass was never biopsied due to the lack of availability of in-house pathology over the weekend.  [facility] due to lack of improvement and overall worsening of her condition in the setting of her uterine mass.   Discharge Exam: BP 103/64 (BP Location: Left Arm)   Pulse 71   Temp 98.2 F (36.8 C) (Oral)   Resp 16   Ht 5\' 8"  (1.727 m)   Wt 183 lb (83 kg)   LMP 09/02/2017 (Within Weeks) Comment: molar pregnancy per MD Schuman  SpO2 99%   BMI 27.83 kg/m  Constitutional: She is oriented to person, place, and time. She appears well-developed and well-nourished.  Appears pale and lethargic  HENT:  Head: Normocephalic and atraumatic.  Eyes: Conjunctivae are normal. No scleral icterus.  Cardiovascular: Normal rate and regular rhythm.  Pulmonary/Chest: Effort normal.  Decreased at the bases.   Abdominal: Soft. Bowel sounds are normal. She exhibits mass (uterus based on previous films). She exhibits no distension. There is tenderness (mild in epigastrium and RUQ, mild at suprapubic to deep palpation). There is no rebound and no guarding.  Musculoskeletal: Normal range of motion. She exhibits no edema.  Neurological: She is alert and oriented to person, place, and time. No cranial nerve deficit.  Skin: Skin is warm and dry. No erythema.  Psychiatric: She has a normal mood and affect. Her behavior is normal. Judgment normal.   Condition at Discharge: Stable  Complications affecting treatment: None  Discharge Medications:  None active on transfer  Follow-up arrangements:  As indicated by her treatment at [facility]  [facility]  Signed: Thomasene MohairStephen Neldon Shepard, MD 11/10/2017 4:57 AM

## 2017-11-10 NOTE — Progress Notes (Signed)
[facility]. Patient accepted.  Per transfer [facility] at [facility], bed availability is limited. Discussed transfer with patient and family and all questions answered.  Patient and family are amenable to transfer. Unclear whether transfer will occur soon or later today.   Bed center to contact Dr. Jerene PitchSchuman, if bed is available after 8am.   Will cancel RUQ ultrasound, as this may be done, if indicated [facility], anyway.  Regular diet for now.  Immodium for ongoing diarrhea (C. Diff negative).  Thomasene MohairStephen Graylen Noboa, MD, Merlinda FrederickFACOG [facility], [facility] 11/10/2017 3:11 AM

## 2017-11-11 MED ORDER — LACTATED RINGERS IV SOLN
INTRAVENOUS | Status: DC
Start: ? — End: 2017-11-11

## 2017-11-11 MED ORDER — LOPERAMIDE HCL 2 MG PO CAPS
2.00 | ORAL_CAPSULE | ORAL | Status: DC
Start: ? — End: 2017-11-11

## 2017-11-11 MED ORDER — LIDOCAINE HCL 1 % IJ SOLN
0.50 | INTRAMUSCULAR | Status: DC
Start: ? — End: 2017-11-11

## 2017-11-11 MED ORDER — GENERIC EXTERNAL MEDICATION
5.00 | Status: DC
Start: ? — End: 2017-11-11

## 2017-11-11 MED ORDER — ONDANSETRON HCL 4 MG/2ML IJ SOLN
4.00 | INTRAMUSCULAR | Status: DC
Start: ? — End: 2017-11-11

## 2017-11-11 MED ORDER — IBUPROFEN 600 MG PO TABS
600.00 | ORAL_TABLET | ORAL | Status: DC
Start: ? — End: 2017-11-11

## 2017-11-11 MED ORDER — IBUPROFEN 100 MG/5ML PO SUSP
600.00 | ORAL | Status: DC
Start: ? — End: 2017-11-11

## 2017-11-11 MED ORDER — KETOROLAC TROMETHAMINE 15 MG/ML IJ SOLN
15.00 | INTRAMUSCULAR | Status: DC
Start: ? — End: 2017-11-11

## 2017-11-13 LAB — CULTURE, BLOOD (ROUTINE X 2)
Culture: NO GROWTH
Culture: NO GROWTH
Special Requests: ADEQUATE

## 2020-05-17 IMAGING — CR DG CHEST 2V
2 series · 2 of 2 positions shown · non-contrast
Comparison: None.

ADDENDUM:
Note that under the clinical data, it should read molar pregnancy
instead of more pregnancy.
CLINICAL DATA: More pregnancy. Pelvic pain. Fever and weakness 4
days.

EXAM:
CHEST - 2 VIEW

[chest pa]
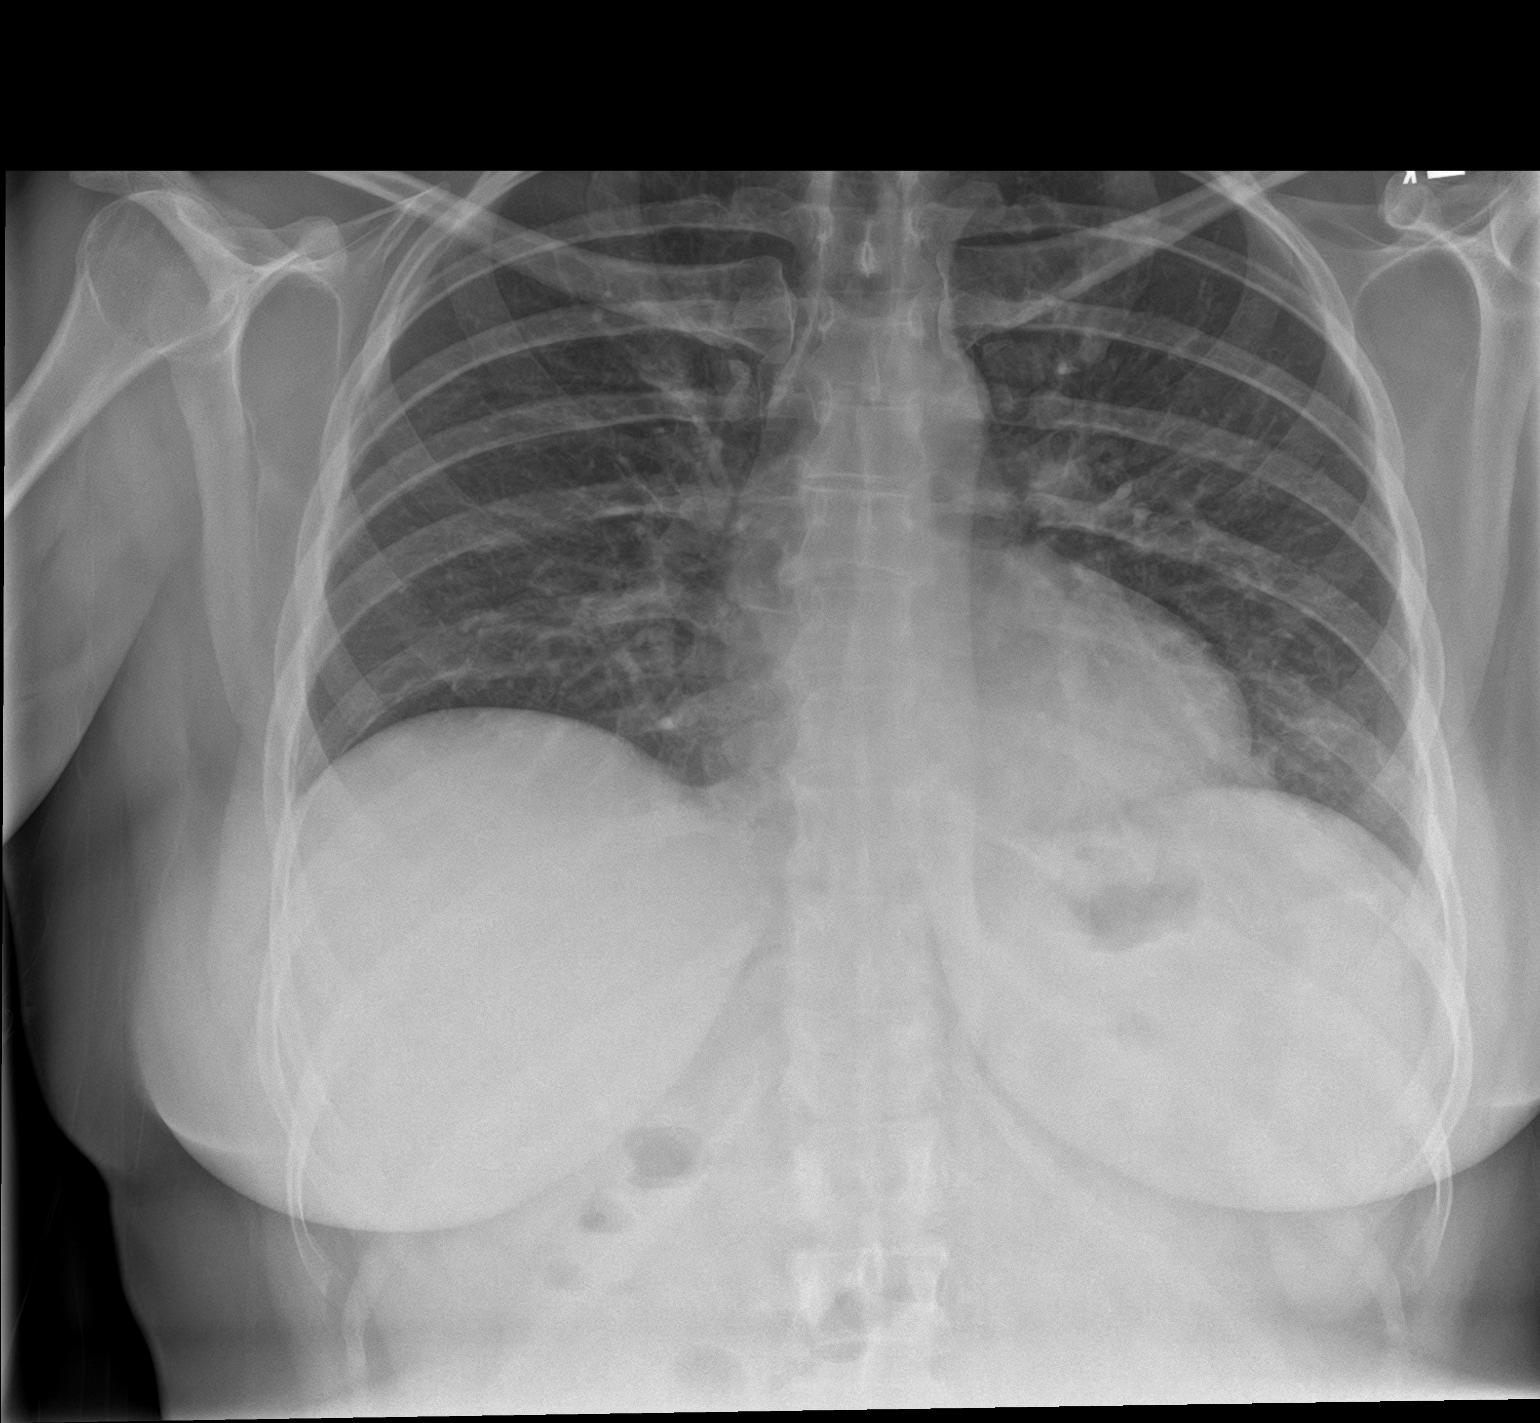

[chest lat]
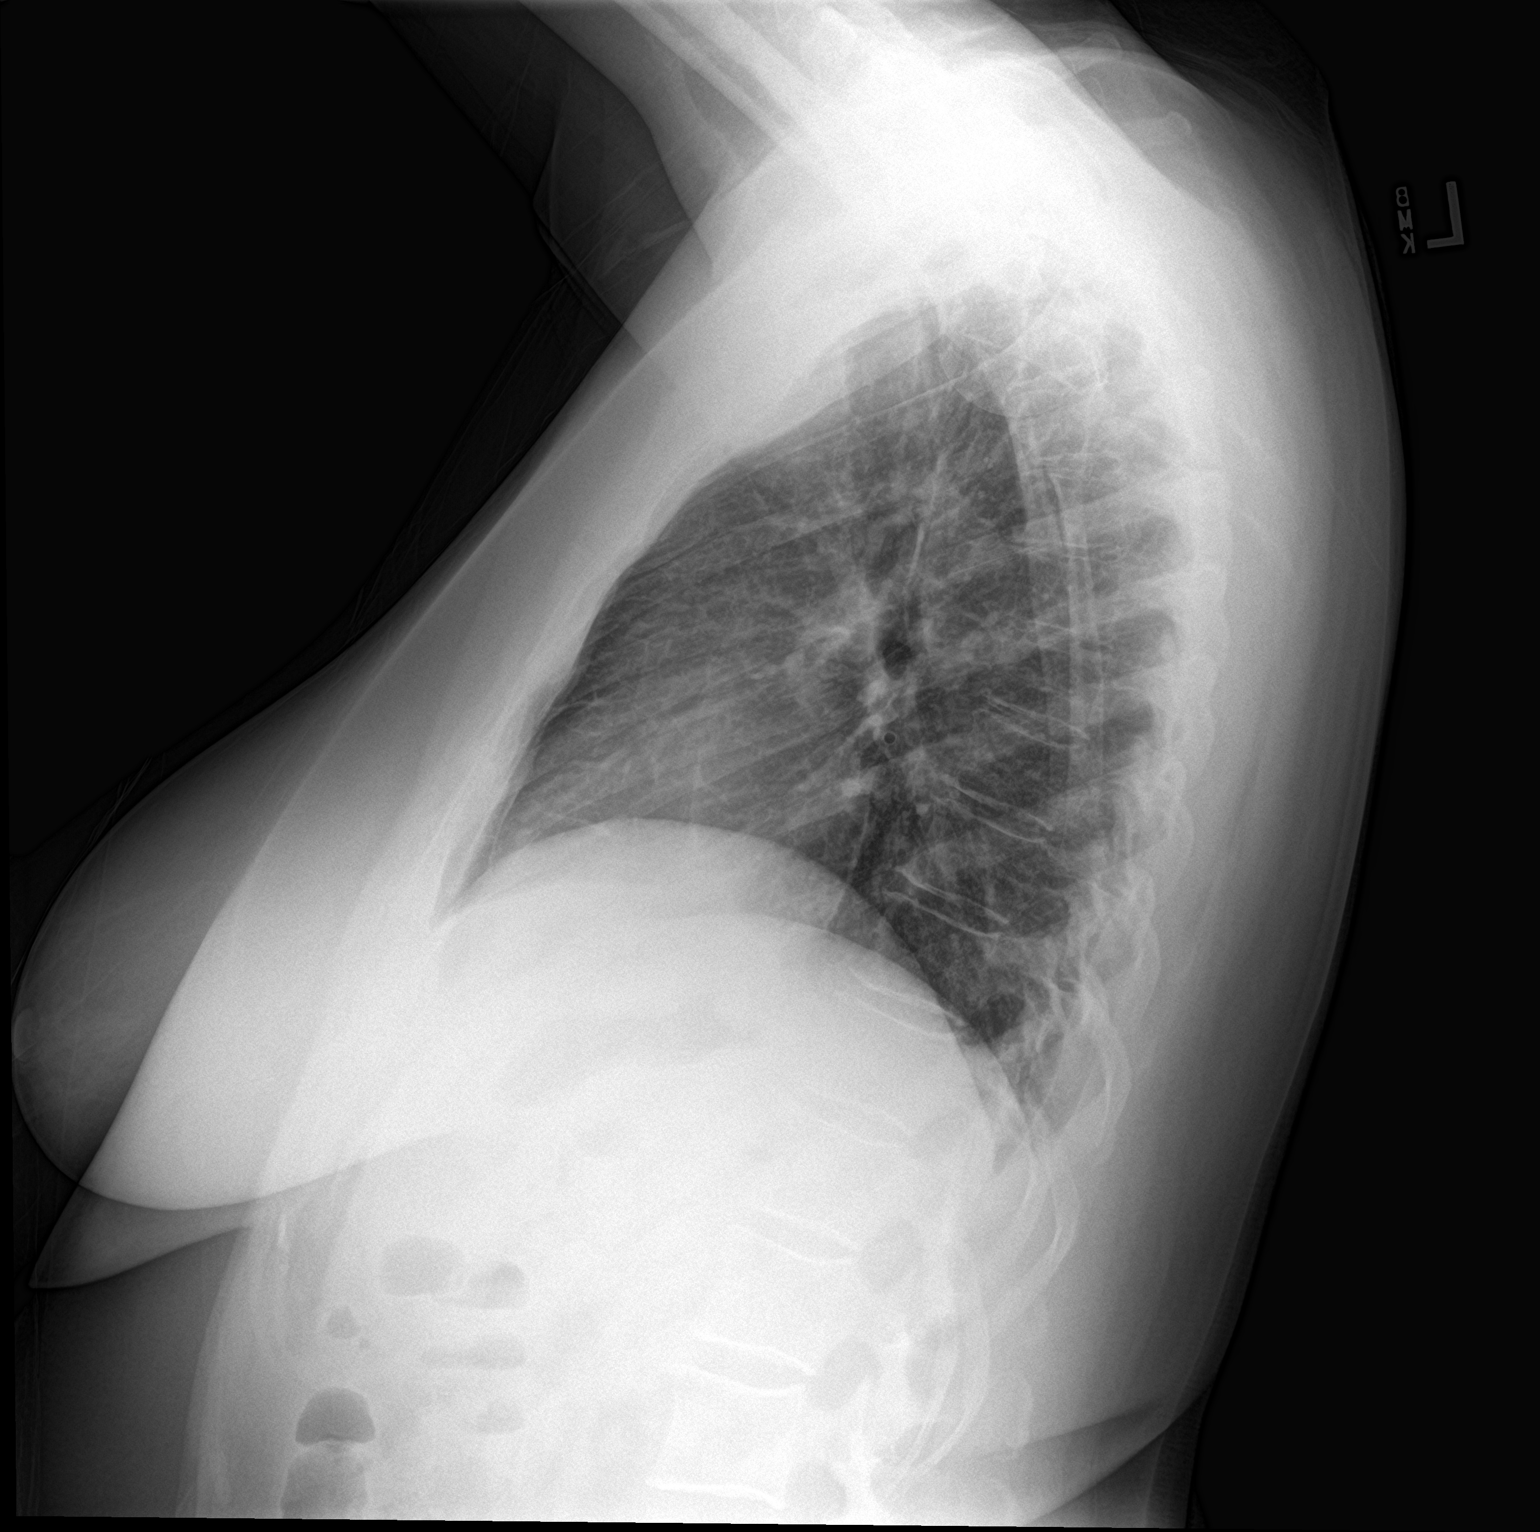

[2 of 2 positions shown; findings below may reference images not displayed]

FINDINGS: Lungs are adequately inflated and otherwise clear. Cardiomediastinal
silhouette is within normal. Bones and soft tissues are normal.
IMPRESSION: No active cardiopulmonary disease.

## 2020-05-17 IMAGING — US US OB < 14 WEEKS - US OB TV
1 series · 13 of 28 positions shown · non-contrast
Comparison: None.

CLINICAL DATA: Pelvic cramping and pain with positive pregnancy
test.

EXAM:
OBSTETRIC <14 WK US AND TRANSVAGINAL OB US
TECHNIQUE: Both transabdominal and transvaginal ultrasound examinations were
performed for complete evaluation of the gestation as well as the
maternal uterus, adnexal regions, and pelvic cul-de-sac.
Transvaginal technique was performed to assess early pregnancy.

[Series 1: us ob < 14 weeks - us ob tv · 13 of 88 slices shown]
[im 4/88]
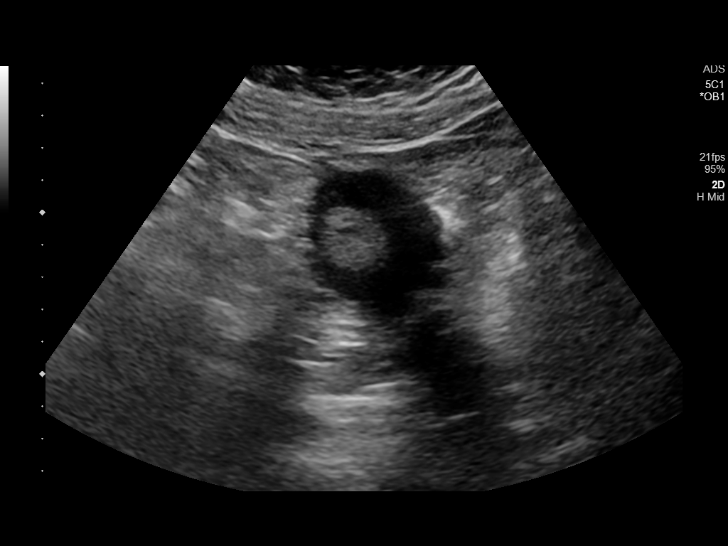
[im 10/88]
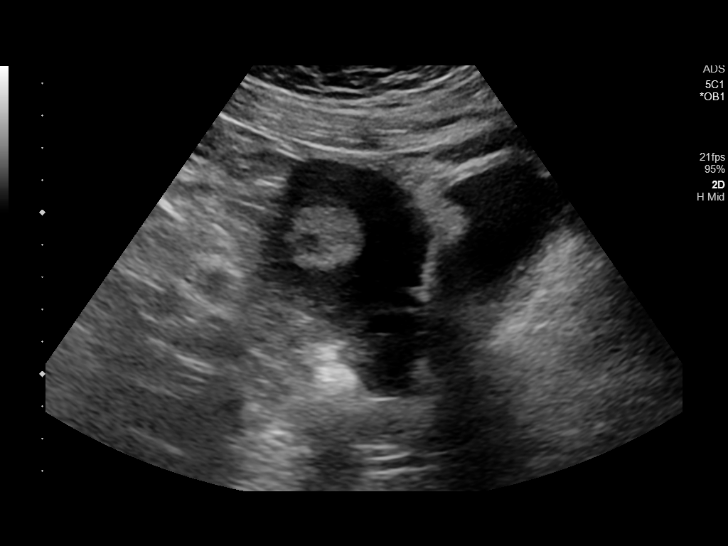
[im 17/88]
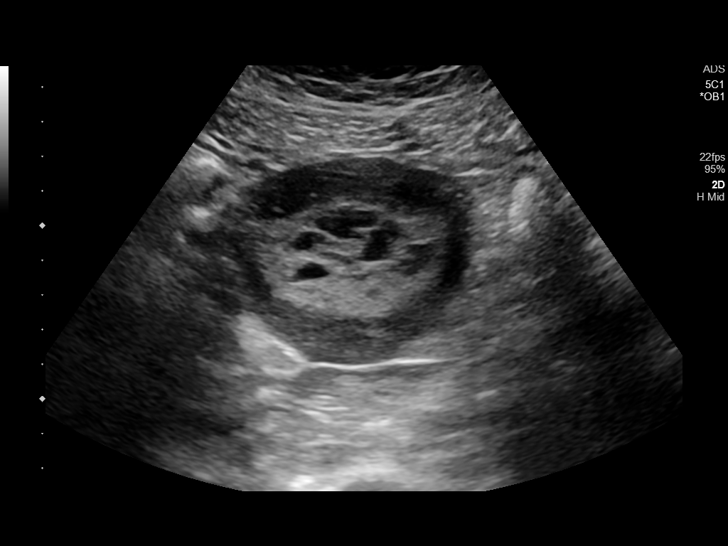
[im 23/88]
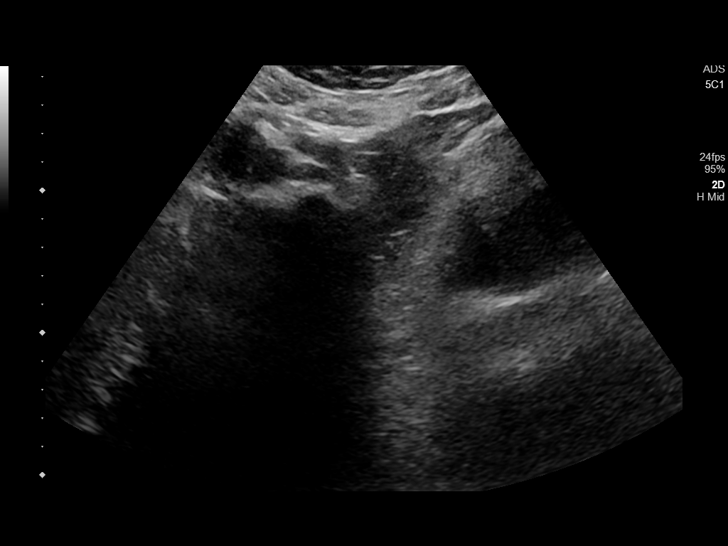
[im 30/88]
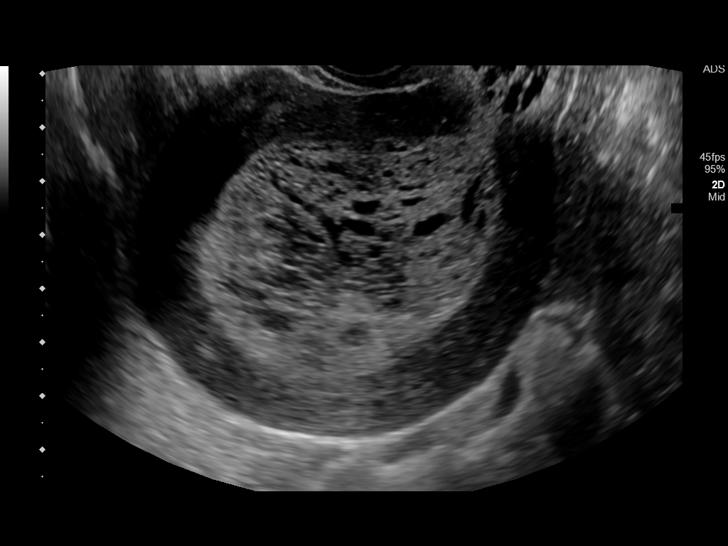
[im 36/88]
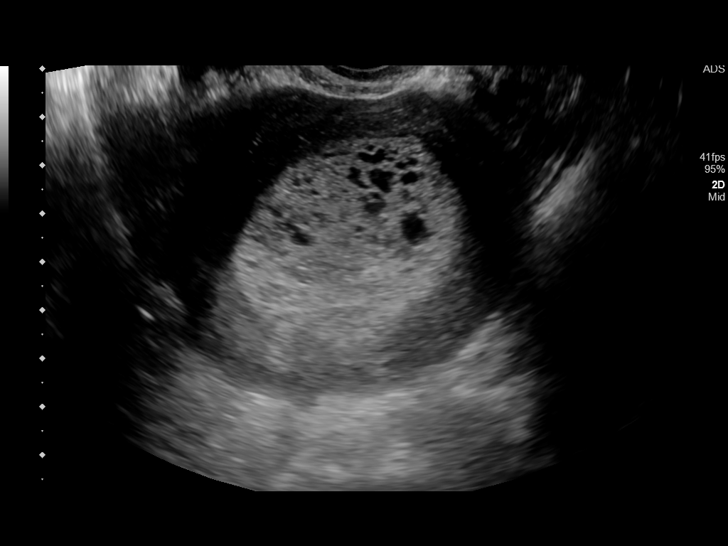
[im 46/88]
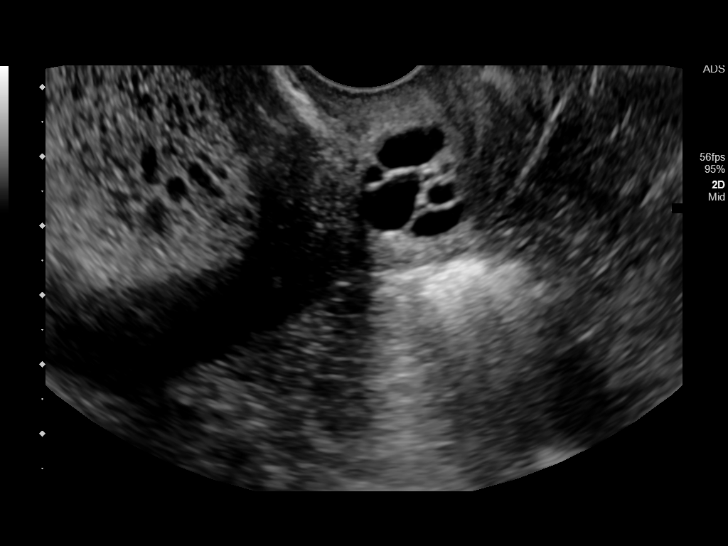
[im 52/88]
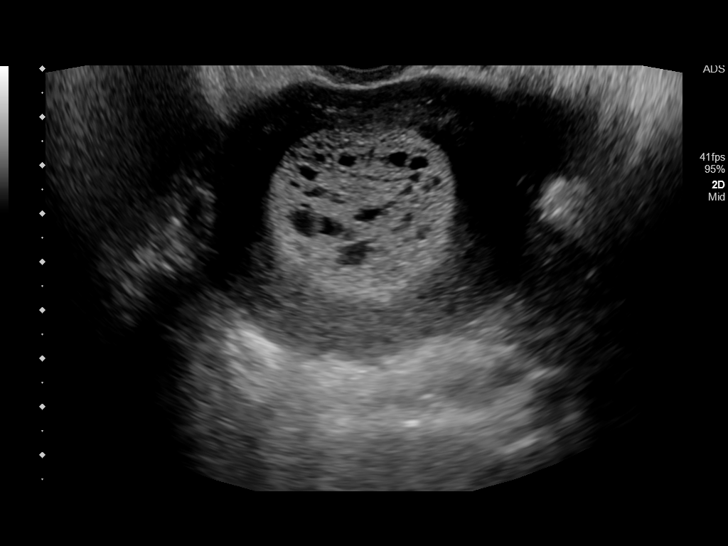
[im 59/88]
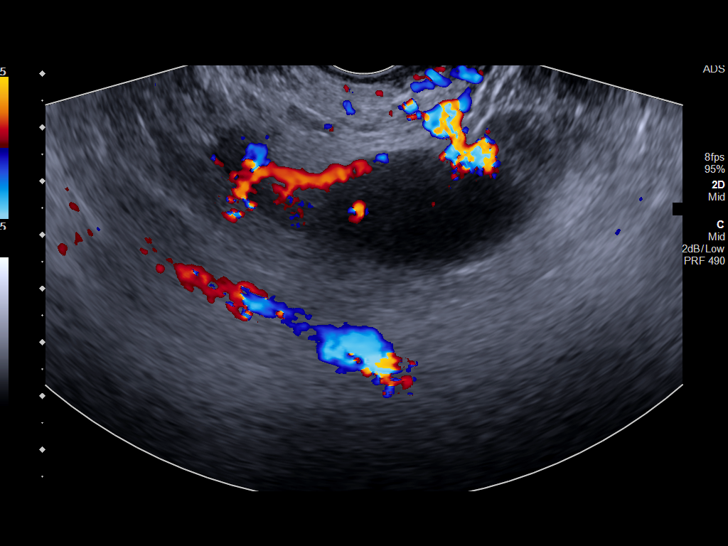
[im 65/88]
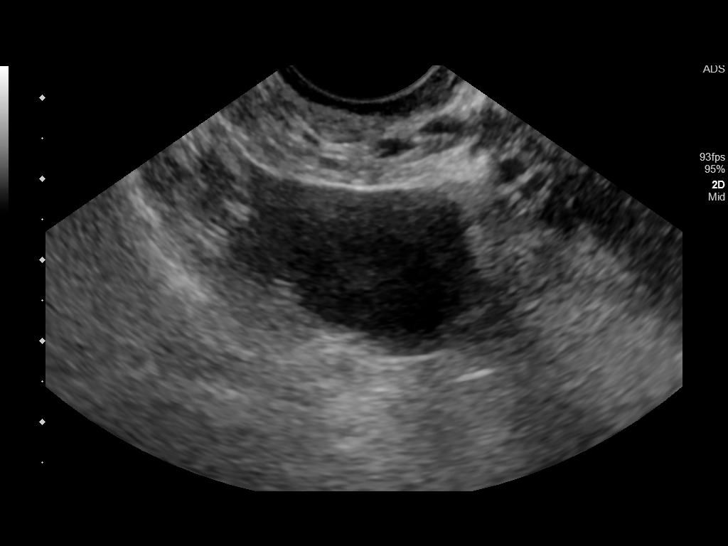
[im 71/88]
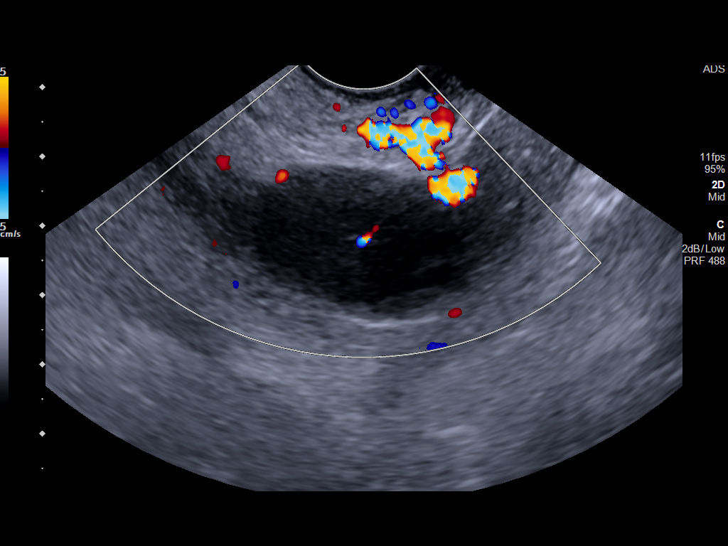
[im 78/88]
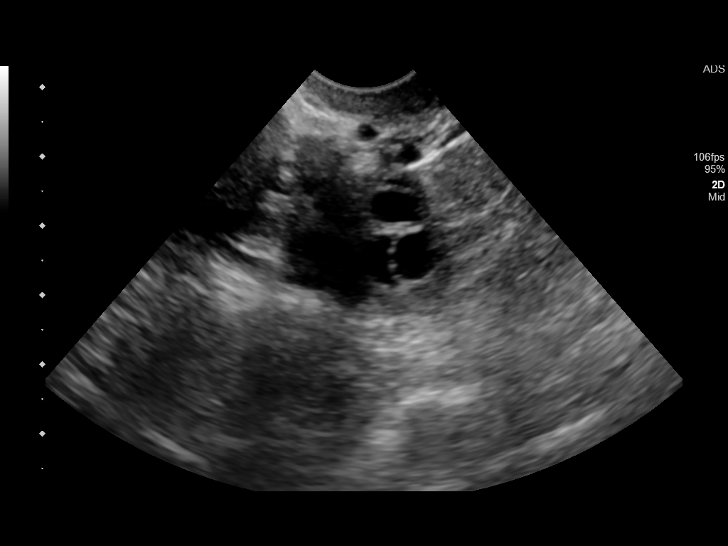
[im 84/88]
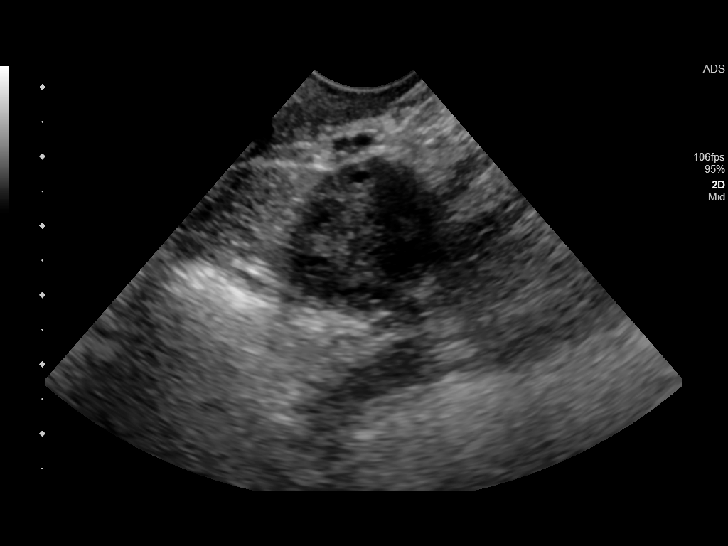

[13 of 28 positions shown; findings below may reference images not displayed]

FINDINGS: Intrauterine gestational sac: Not visualized.

Yolk sac:  Not visualized.

Embryo:  Not visualized.

Abnormal tissue fills and expands the endometrial canal to 5 cm in
diameter. The abnormal tissue is diffusely hyperechoic with
innumerable cystic foci contained within. This tissue tracks through
the internal cervical os into the cervical canal.

Subchorionic hemorrhage:  None visualized.

Maternal uterus/adnexae: Maternal ovaries are unremarkable. No
evidence for intraperitoneal free fluid.
IMPRESSION: Abnormal hyperechoic tissue in the endometrial cavity with
innumerable cystic foci. Ultrasound imaging features are consistent
with gestational trophoblastic disease which includes etiologies
such as hydatidiform mole and choriocarcinoma.

These results will be called to the ordering clinician or
representative by the Radiologist Assistant, and communication
documented in the PACS or zVision Dashboard.

## 2020-05-19 IMAGING — CR DG CHEST 2V
2 series · 2 of 2 positions shown · non-contrast
Comparison: 11/07/2017

CLINICAL DATA: Hypoxemia

EXAM:
CHEST - 2 VIEW

[chest pa]
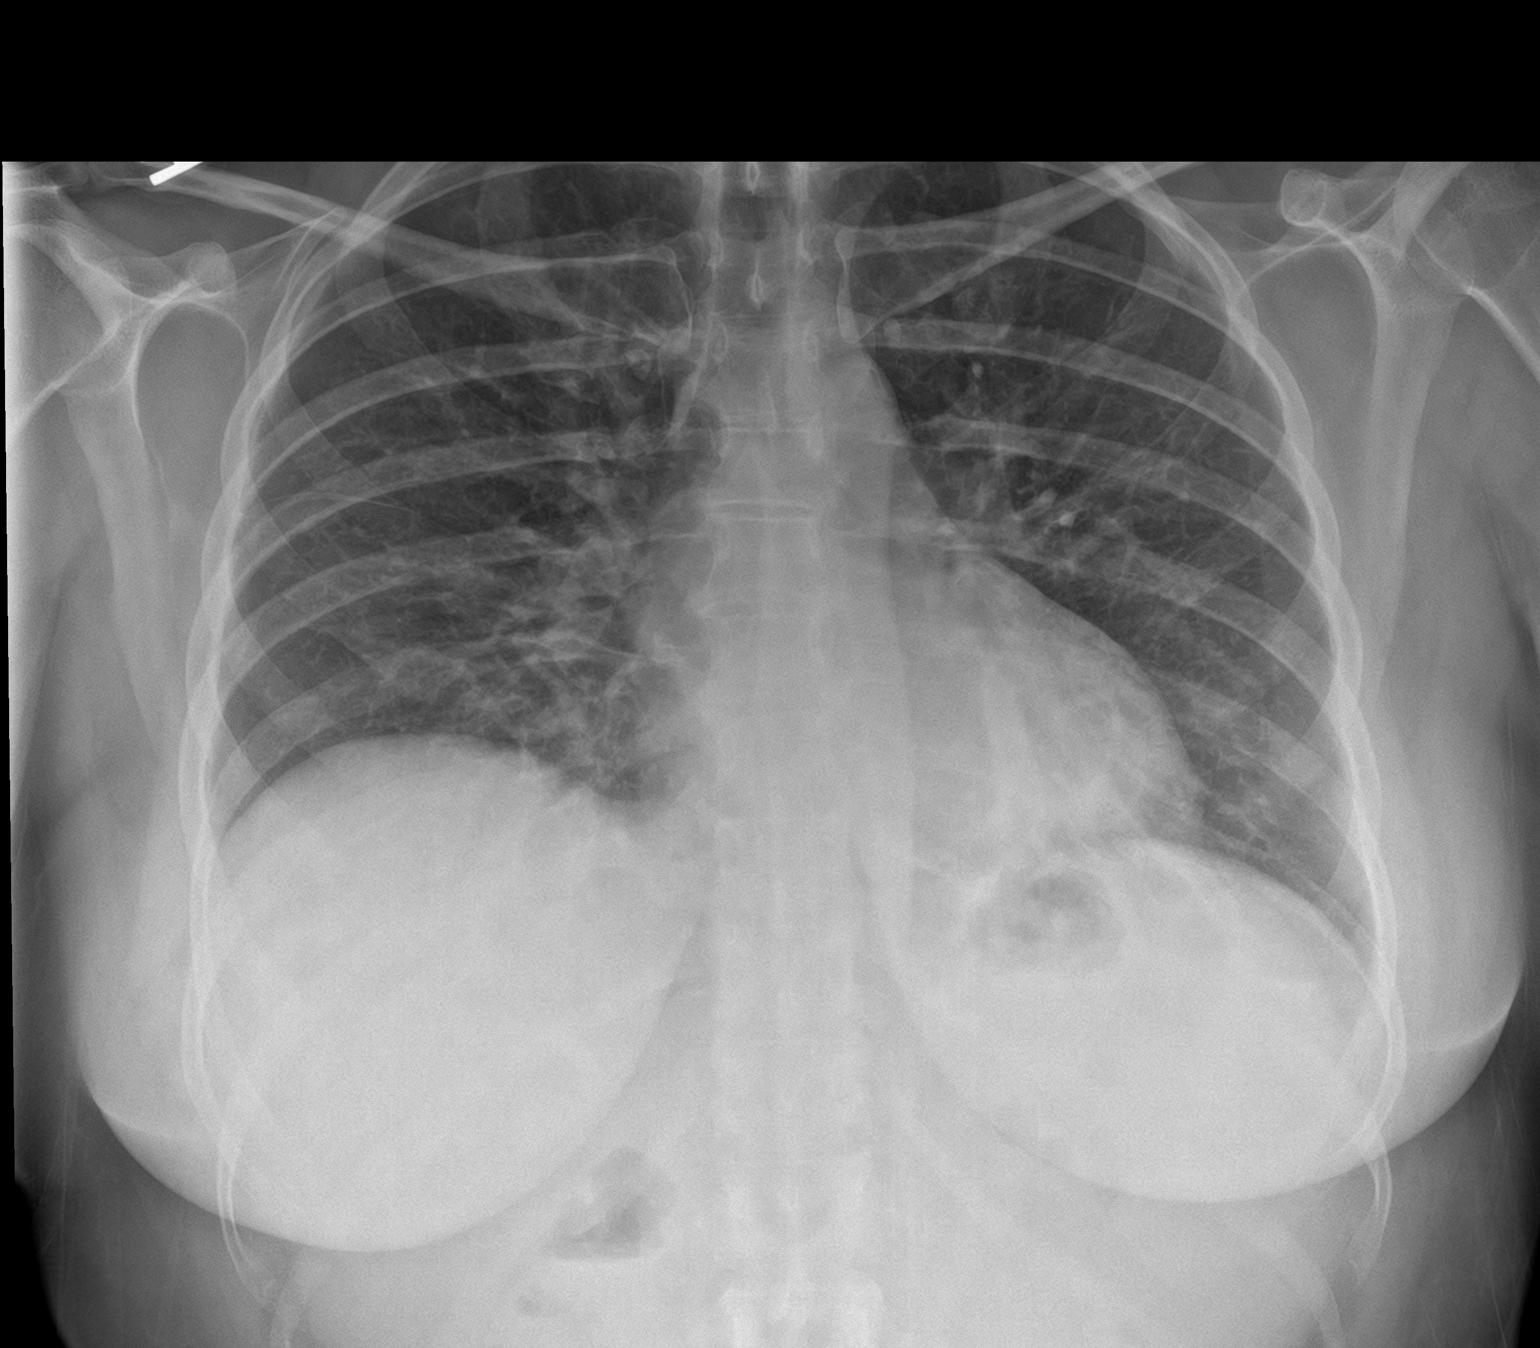

[chest lat]
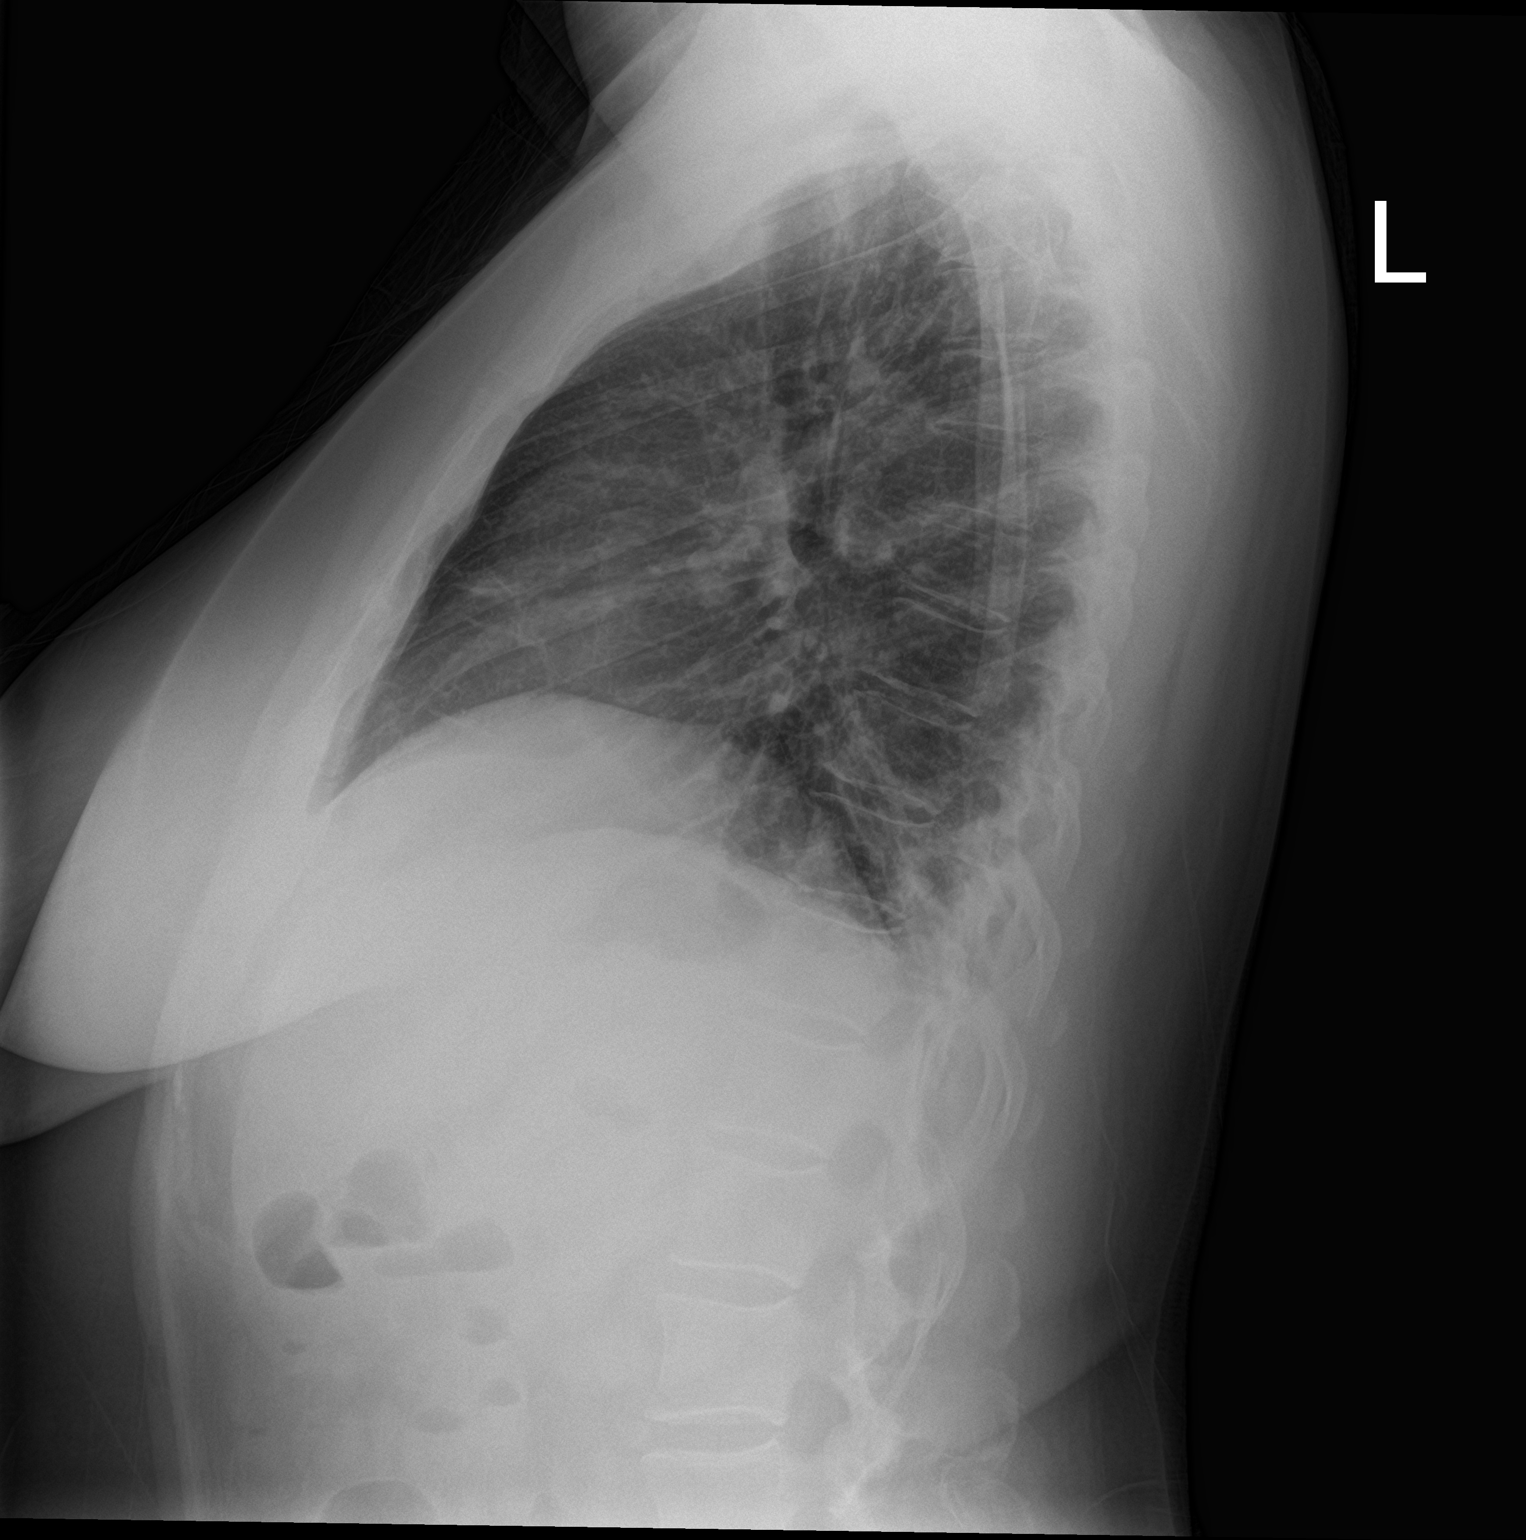

[2 of 2 positions shown; findings below may reference images not displayed]

FINDINGS: Normal heart size, mediastinal contours, and pulmonary vascularity.

Subsegmental atelectasis RIGHT base.

Atelectasis versus consolidation in retrocardiac LEFT lower lobe.

Remaining lungs clear.

No pleural effusion or pneumothorax.

Bones unremarkable.
IMPRESSION: RIGHT basilar atelectasis with atelectasis versus consolidation in
retrocardiac LEFT lower lobe.

## 2021-12-06 ENCOUNTER — Ambulatory Visit (INDEPENDENT_AMBULATORY_CARE_PROVIDER_SITE_OTHER): Payer: BLUE CROSS/BLUE SHIELD | Admitting: Licensed Clinical Social Worker

## 2021-12-06 DIAGNOSIS — F4321 Adjustment disorder with depressed mood: Secondary | ICD-10-CM | POA: Diagnosis not present

## 2021-12-06 NOTE — BH Specialist Note (Signed)
Integrated Behavioral Health via Telemedicine Visit  12/06/2021 Fedora Knisely [id number]  Number of Integrated Behavioral Health Clinician visits: No data recorded Session Start time: 1300   Session End time: 1355  Total time in minutes: 55   Referring Provider: Marlowe Kays Patient/Family location: Home [facility] Provider location: Office All persons participating in visit: Pt Types of Service: Individual psychotherapy and Video visit  I connected with Richrd Humbles and/or Herbert Seta Napp's patient via  Telephone or Engineer, civil (consulting)  (Video is Surveyor, mining) and verified that I am speaking with the correct person using two identifiers. Discussed confidentiality: Yes   I discussed the limitations of telemedicine and the availability of in person appointments.  Discussed there is a possibility of technology failure and discussed alternative modes of communication if that failure occurs.  I discussed that engaging in this telemedicine visit, they consent to the provision of behavioral healthcare and the services will be billed under their insurance.  Patient and/or legal guardian expressed understanding and consented to Telemedicine visit: Yes   Presenting Concerns: Patient and/or family reports the following symptoms/concerns: Feelings of being overwhelmed with caring for her mother who has ALZ dementia and father who has significant medical issues and her brother who is disabled .  Concerned and anxious about what the future may hold for herself and family members.   Duration of problem: Several months ; Severity of problem: moderate  Patient and/or Family's Strengths/Protective Factors: Concrete supports in place (healthy food, safe environments, etc.)  Goals Addressed: Patient will:  Reduce symptoms of: anxiety, mood instability, and stress   Increase knowledge and/or ability of: coping skills, self-management skills, and stress reduction   Demonstrate  ability to: Increase healthy adjustment to current life circumstances and Increase adequate support systems for patient/family  Progress towards Goals: Ongoing  Interventions: Interventions utilized:  Solution-Focused Strategies, Supportive Counseling, and Link to [employer] Assessments completed: Not Needed  Patient and/or Family Response: Pt open to resources provided and ways of increasing supports in the home and enhance wellbeing not only for her family but also for this caregiver   Assessment: Patient currently experiencing mood instability, feelings of being anxious some depressed mood, feelings of being overwhelmed of taking care of her family and being the primary caregiver for three family members with significant medical issues  .   Patient may benefit from maximizing the use of formal and informal support system.  Consult with Olean Ree attorney to help navigate legal issues and medicaid and possible long term care in the future . Salvadore Dom addition, adult day center to provide respite for this caregiver , other respite options discussed including senior link, and [place] Project Voucher program.  LCSW encouraged Support group, and discussed the benefits of that safe place .  Finally general self care information was provided with grieving the loss of the situation, emotions she has felt and will be feeling to provide valuation of a caregiver and tips for moving forward.    Plan: Follow up with behavioral health clinician on : As Needed  Behavioral recommendations: Patient may benefit from maximizing the use of formal and informal support system.  Consult with Olean Ree attorney to help navigate legal issues and medicaid and possible long term care in the future . Salvadore Dom addition, adult day center to provide respite for this caregiver , other respite options discussed including senior link, and  Project Voucher program.  LCSW encouraged Support group, and discussed the  benefits of that safe place .  Finally general self care information was provided with grieving the loss of the situation, emotions she has felt and will be feeling to provide valuation of a caregiver and tips for moving forward.   Referral(s): Community Resources:  Lovenia Shuck, PACE, Adult Day Center, ALZ Support Group, Care In the home such as Care[employer] and [place] , Project Care and Seniorlink for respite   I discussed the assessment and treatment plan with the patient and/or parent/guardian. They were provided an opportunity to ask questions and all were answered. They agreed with the plan and demonstrated an understanding of the instructions.   They were advised to call back or seek an in-person evaluation if the symptoms worsen or if the condition fails to improve as anticipated.  Marsha Hillman A Taylor-Paladino, LCSW
